# Patient Record
Sex: Female | Born: 1940 | Race: White | Hispanic: No | Marital: Single | Smoking: Never smoker
Health system: Southern US, Community
[De-identification: ages and names within clinical notes are randomized; demographics above are authoritative.]

## PROBLEM LIST (undated history)

## (undated) DIAGNOSIS — Z89611 Acquired absence of right leg above knee: Secondary | ICD-10-CM

## (undated) DIAGNOSIS — M069 Rheumatoid arthritis, unspecified: Secondary | ICD-10-CM

## (undated) DIAGNOSIS — M0510 Rheumatoid lung disease with rheumatoid arthritis of unspecified site: Secondary | ICD-10-CM

## (undated) DIAGNOSIS — I10 Essential (primary) hypertension: Secondary | ICD-10-CM

## (undated) DIAGNOSIS — I739 Peripheral vascular disease, unspecified: Secondary | ICD-10-CM

## (undated) HISTORY — PX: APPENDECTOMY: SHX54

---

## 2016-09-17 ENCOUNTER — Encounter (HOSPITAL_BASED_OUTPATIENT_CLINIC_OR_DEPARTMENT_OTHER): Payer: Self-pay | Admitting: *Deleted

## 2016-09-17 ENCOUNTER — Emergency Department (HOSPITAL_BASED_OUTPATIENT_CLINIC_OR_DEPARTMENT_OTHER): Payer: Medicare (Managed Care)

## 2016-09-17 ENCOUNTER — Inpatient Hospital Stay (HOSPITAL_BASED_OUTPATIENT_CLINIC_OR_DEPARTMENT_OTHER)
Admission: EM | Admit: 2016-09-17 | Discharge: 2016-09-21 | DRG: 871 | Disposition: A | Discharge status: Home / Self Care | Payer: Medicare (Managed Care) | Attending: Internal Medicine | Admitting: Internal Medicine

## 2016-09-17 DIAGNOSIS — E785 Hyperlipidemia, unspecified: Secondary | ICD-10-CM | POA: Diagnosis present

## 2016-09-17 DIAGNOSIS — A4151 Sepsis due to Escherichia coli [E. coli]: Principal | ICD-10-CM | POA: Diagnosis present

## 2016-09-17 DIAGNOSIS — Z89611 Acquired absence of right leg above knee: Secondary | ICD-10-CM

## 2016-09-17 DIAGNOSIS — L8990 Pressure ulcer of unspecified site, unspecified stage: Secondary | ICD-10-CM | POA: Diagnosis present

## 2016-09-17 DIAGNOSIS — M0510 Rheumatoid lung disease with rheumatoid arthritis of unspecified site: Secondary | ICD-10-CM | POA: Diagnosis present

## 2016-09-17 DIAGNOSIS — N289 Disorder of kidney and ureter, unspecified: Secondary | ICD-10-CM

## 2016-09-17 DIAGNOSIS — I10 Essential (primary) hypertension: Secondary | ICD-10-CM | POA: Diagnosis present

## 2016-09-17 DIAGNOSIS — I110 Hypertensive heart disease with heart failure: Secondary | ICD-10-CM | POA: Diagnosis present

## 2016-09-17 DIAGNOSIS — R7989 Other specified abnormal findings of blood chemistry: Secondary | ICD-10-CM

## 2016-09-17 DIAGNOSIS — J189 Pneumonia, unspecified organism: Secondary | ICD-10-CM | POA: Diagnosis present

## 2016-09-17 DIAGNOSIS — I248 Other forms of acute ischemic heart disease: Secondary | ICD-10-CM | POA: Diagnosis present

## 2016-09-17 DIAGNOSIS — R6510 Systemic inflammatory response syndrome (SIRS) of non-infectious origin without acute organ dysfunction: Secondary | ICD-10-CM | POA: Diagnosis present

## 2016-09-17 DIAGNOSIS — M25552 Pain in left hip: Secondary | ICD-10-CM

## 2016-09-17 DIAGNOSIS — R4182 Altered mental status, unspecified: Secondary | ICD-10-CM | POA: Diagnosis not present

## 2016-09-17 DIAGNOSIS — K7200 Acute and subacute hepatic failure without coma: Secondary | ICD-10-CM | POA: Diagnosis present

## 2016-09-17 DIAGNOSIS — R945 Abnormal results of liver function studies: Secondary | ICD-10-CM

## 2016-09-17 DIAGNOSIS — G8929 Other chronic pain: Secondary | ICD-10-CM | POA: Diagnosis present

## 2016-09-17 DIAGNOSIS — R778 Other specified abnormalities of plasma proteins: Secondary | ICD-10-CM | POA: Diagnosis present

## 2016-09-17 DIAGNOSIS — I739 Peripheral vascular disease, unspecified: Secondary | ICD-10-CM | POA: Diagnosis present

## 2016-09-17 DIAGNOSIS — E860 Dehydration: Secondary | ICD-10-CM | POA: Diagnosis present

## 2016-09-17 DIAGNOSIS — N390 Urinary tract infection, site not specified: Secondary | ICD-10-CM | POA: Diagnosis present

## 2016-09-17 DIAGNOSIS — H409 Unspecified glaucoma: Secondary | ICD-10-CM | POA: Diagnosis present

## 2016-09-17 DIAGNOSIS — L89151 Pressure ulcer of sacral region, stage 1: Secondary | ICD-10-CM | POA: Diagnosis present

## 2016-09-17 DIAGNOSIS — A419 Sepsis, unspecified organism: Secondary | ICD-10-CM

## 2016-09-17 DIAGNOSIS — I5032 Chronic diastolic (congestive) heart failure: Secondary | ICD-10-CM | POA: Diagnosis present

## 2016-09-17 DIAGNOSIS — W19XXXA Unspecified fall, initial encounter: Secondary | ICD-10-CM

## 2016-09-17 DIAGNOSIS — F419 Anxiety disorder, unspecified: Secondary | ICD-10-CM | POA: Diagnosis present

## 2016-09-17 DIAGNOSIS — N179 Acute kidney failure, unspecified: Secondary | ICD-10-CM | POA: Diagnosis present

## 2016-09-17 DIAGNOSIS — R000 Tachycardia, unspecified: Secondary | ICD-10-CM

## 2016-09-17 HISTORY — DX: Rheumatoid arthritis, unspecified: M06.9

## 2016-09-17 HISTORY — DX: Peripheral vascular disease, unspecified: I73.9

## 2016-09-17 HISTORY — DX: Acquired absence of right leg above knee: Z89.611

## 2016-09-17 HISTORY — DX: Essential (primary) hypertension: I10

## 2016-09-17 HISTORY — DX: Rheumatoid lung disease with rheumatoid arthritis of unspecified site: M05.10

## 2016-09-17 LAB — COMPREHENSIVE METABOLIC PANEL
ALK PHOS: 115 U/L (ref 38–126)
ALT: 837 U/L — AB (ref 14–54)
AST: 355 U/L — AB (ref 15–41)
Albumin: 2.7 g/dL — ABNORMAL LOW (ref 3.5–5.0)
Anion gap: 5 (ref 5–15)
BUN: 49 mg/dL — AB (ref 6–20)
CALCIUM: 7.6 mg/dL — AB (ref 8.9–10.3)
CHLORIDE: 105 mmol/L (ref 101–111)
CO2: 25 mmol/L (ref 22–32)
CREATININE: 2.29 mg/dL — AB (ref 0.44–1.00)
GFR calc Af Amer: 23 mL/min — ABNORMAL LOW (ref >60)
GFR calc non Af Amer: 20 mL/min — ABNORMAL LOW (ref >60)
Glucose, Bld: 91 mg/dL (ref 65–99)
Potassium: 3.4 mmol/L — ABNORMAL LOW (ref 3.5–5.1)
Sodium: 135 mmol/L (ref 135–145)
Total Bilirubin: 2.7 mg/dL — ABNORMAL HIGH (ref 0.3–1.2)
Total Protein: 5.4 g/dL — ABNORMAL LOW (ref 6.5–8.1)

## 2016-09-17 LAB — CBC WITH DIFFERENTIAL/PLATELET
Basophils Absolute: 0 10*3/uL (ref 0.0–0.1)
Basophils Relative: 0 %
Eosinophils Absolute: 0.1 10*3/uL (ref 0.0–0.7)
Eosinophils Relative: 1 %
HCT: 39.7 % (ref 36.0–46.0)
Hemoglobin: 13.1 g/dL (ref 12.0–15.0)
LYMPHS PCT: 5 %
Lymphs Abs: 0.5 10*3/uL — ABNORMAL LOW (ref 0.7–4.0)
MCH: 32.1 pg (ref 26.0–34.0)
MCHC: 33 g/dL (ref 30.0–36.0)
MCV: 97.3 fL (ref 78.0–100.0)
MONO ABS: 0.7 10*3/uL (ref 0.1–1.0)
MONOS PCT: 6 %
Neutro Abs: 9.6 10*3/uL — ABNORMAL HIGH (ref 1.7–7.7)
Neutrophils Relative %: 88 %
Platelets: 134 10*3/uL — ABNORMAL LOW (ref 150–400)
RBC: 4.08 MIL/uL (ref 3.87–5.11)
RDW: 12.8 % (ref 11.5–15.5)
WBC: 11 10*3/uL — ABNORMAL HIGH (ref 4.0–10.5)

## 2016-09-17 LAB — URINALYSIS, MICROSCOPIC (REFLEX)

## 2016-09-17 LAB — URINALYSIS, ROUTINE W REFLEX MICROSCOPIC
GLUCOSE, UA: NEGATIVE mg/dL
Ketones, ur: 15 mg/dL — AB
Nitrite: POSITIVE — AB
PROTEIN: 100 mg/dL — AB
SPECIFIC GRAVITY, URINE: 1.02 (ref 1.005–1.030)
pH: 5 (ref 5.0–8.0)

## 2016-09-17 LAB — I-STAT CG4 LACTIC ACID, ED: Lactic Acid, Venous: 0.93 mmol/L (ref 0.5–1.9)

## 2016-09-17 LAB — TROPONIN I: TROPONIN I: 0.39 ng/mL — AB (ref <0.03)

## 2016-09-17 MED ORDER — SODIUM CHLORIDE 0.9 % IV BOLUS (SEPSIS)
1000.0000 mL | Freq: Once | INTRAVENOUS | Status: AC
  Administered 2016-09-17: 1000 mL via INTRAVENOUS

## 2016-09-17 MED ORDER — PIPERACILLIN-TAZOBACTAM 3.375 G IVPB
INTRAVENOUS | Status: AC
  Administered 2016-09-18: 3.375 g via INTRAVENOUS
  Filled 2016-09-17: qty 50

## 2016-09-17 MED ORDER — VANCOMYCIN HCL IN DEXTROSE 1-5 GM/200ML-% IV SOLN
1000.0000 mg | Freq: Once | INTRAVENOUS | Status: AC
  Administered 2016-09-17: 1000 mg via INTRAVENOUS
  Filled 2016-09-17: qty 200

## 2016-09-17 MED ORDER — PIPERACILLIN-TAZOBACTAM 4.5 G IVPB
4.5000 g | Freq: Once | INTRAVENOUS | Status: DC
  Filled 2016-09-17: qty 100

## 2016-09-17 MED ORDER — SODIUM CHLORIDE 0.9 % IV BOLUS (SEPSIS)
1000.0000 mL | Freq: Once | INTRAVENOUS | Status: AC
Start: 2016-09-17 — End: 2016-09-18
  Administered 2016-09-17: 1000 mL via INTRAVENOUS

## 2016-09-17 NOTE — ED Provider Notes (Signed)
[facility] Provider Note   CSN: [id number] Arrival date & time: 09/17/16  2043  By signing my name below, I, Doreatha Martin, attest that this documentation has been prepared under the direction and in the presence of Jerelyn Scott, MD. Electronically Signed: Doreatha Martin, ED Scribe. 09/17/16. 10:11 PM.     History   Chief Complaint Chief Complaint  Patient presents with  . Altered Mental Status    HPI Allison Mendez is a 76 y.o. female who presents to the Emergency Department complaining of gradually worsening AMS s/p fall that occurred 2 days ago. Per sister, the pt vomited 2 days ago (which has since resolved), fell that night out of her bed and was groggy yesterday. Sister states the pt was falling asleep all day and seemed a bit confused during conversations as she jumped from topic to topic. Sister states the pt is drinking without difficulty, but eating less. Pt states she feels generally fatigued. Sister denies fever, current vomiting.   The history is provided by the patient and a relative. No language interpreter was used.  Altered Mental Status   This is a new problem. The current episode started 2 days ago. The problem has been gradually worsening. Associated symptoms include confusion.    Past Medical History:  Diagnosis Date  . Hypertension   . PVD (peripheral vascular disease) (HCC)   . RA (rheumatoid arthritis) (HCC)   . 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 Acute kidney injury (HCC) 09/18/2016    No past surgical history on file.  OB History    No data available       Home Medications    Prior to Admission medications   Not on File    Family History No family history on file.  Social History Social History  Substance Use Topics  . Smoking status: Never Smoker  . Smokeless tobacco: Not on file  . Alcohol use No     Allergies   Patient has no known allergies.   Review of Systems Review  of Systems  Constitutional: Positive for appetite change and fatigue.  Gastrointestinal: Negative for vomiting.  Psychiatric/Behavioral: Positive for confusion.  All other systems reviewed and are negative.   Physical Exam Updated Vital Signs BP 107/70   Pulse (!) 106   Temp 100.1 F (37.8 C) (Rectal)   Resp 20   Ht 5\' 7"  (1.702 m)   SpO2 98%  Vitals reviewed Physical Exam Physical Examination: General appearance - alert, chronically ill appearing, tired appearing, answering questions- but slowly Mental status - alert, oriented to person, place, and time Eyes - pupils equal and reactive, extraocular eye movements intact, no scleral icterus, no conjunctival injection Mouth - mucous membranes dry, OP clear Chest - clear to auscultation, no wheezes, rales or rhonchi, symmetric air entry Heart - rapid rate, regular rhythm, normal S1, S2, no murmurs, rubs, clicks or gallops Abdomen - soft, nontender, nondistended, no masses or organomegaly, nabs Neurological - alert, oriented x 3, normal speech, strength intact, pt answering questions but slowly/tired appearing, sensation intact Extremities - peripheral pulses normal, no pedal edema, no clubbing or cyanosis Skin - normal coloration, poor skin turgor, no rashes  ED Treatments / Results   DIAGNOSTIC STUDIES: Oxygen Saturation is 98% on [place] 2L, normal by my interpretation.    COORDINATION OF CARE: 10:11 PM Discussed treatment plan with pt at bedside which includes labs and pt agreed to plan.  Labs (all labs ordered are listed, but only abnormal results are displayed) Labs Reviewed  COMPREHENSIVE METABOLIC PANEL - Abnormal; Notable for the following:       Result Value   Potassium 3.4 (*)    BUN 49 (*)    Creatinine, Ser 2.29 (*)    Calcium 7.6 (*)    Total Protein 5.4 (*)    Albumin 2.7 (*)    AST 355 (*)    ALT 837 (*)    Total Bilirubin 2.7 (*)    GFR calc non Af Amer 20 (*)    GFR calc Af Amer 23 (*)    All other  components within normal limits  TROPONIN I - Abnormal; Notable for the following:    Troponin I 0.39 (*)    All other components within normal limits  URINALYSIS, ROUTINE W REFLEX MICROSCOPIC - Abnormal; Notable for the following:    Color, Urine ORANGE (*)    APPearance CLOUDY (*)    Hgb urine dipstick MODERATE (*)    Bilirubin Urine LARGE (*)    Ketones, ur 15 (*)    Protein, ur 100 (*)    Nitrite POSITIVE (*)    Leukocytes, UA TRACE (*)    All other components within normal limits  CBC WITH DIFFERENTIAL/PLATELET - Abnormal; Notable for the following:    WBC 11.0 (*)    Platelets 134 (*)    Neutro Abs 9.6 (*)    Lymphs Abs 0.5 (*)    All other components within normal limits  URINALYSIS, MICROSCOPIC (REFLEX) - Abnormal; Notable for the following:    Bacteria, UA FEW (*)    Squamous Epithelial / LPF 0-5 (*)    All other components within normal limits  ACETAMINOPHEN LEVEL - Abnormal; Notable for the following:    Acetaminophen (Tylenol), Serum <10 (*)    All other components within normal limits  CULTURE, BLOOD (ROUTINE X 2)  CULTURE, BLOOD (ROUTINE X 2)  URINE CULTURE  AMMONIA  CBC WITH DIFFERENTIAL/PLATELET  I-STAT CG4 LACTIC ACID, ED  I-STAT CG4 LACTIC ACID, ED    EKG  EKG Interpretation  Date/Time:  Wednesday September 17 2016 21:05:59 EDT Ventricular Rate:  131 PR Interval:    QRS Duration: 82 QT Interval:  296 QTC Calculation: 437 R Axis:   81 Text Interpretation:  Sinus tachycardia Borderline right axis deviation No old tracing to compare Confirmed by [facility]  MD, MARTHA [phone]) on 09/17/2016 11:10:40 PM       Radiology Dg Chest 2 View  Result Date: 09/17/2016 CLINICAL DATA:  Acute onset of confusion and lethargy. Status post fall out of bed 2 days ago. Initial encounter. EXAM: CHEST  2 VIEW COMPARISON:  None. FINDINGS: The lungs are well-aerated. Mild bibasilar opacities raise question for mild pneumonia. There is no evidence of pleural effusion or  pneumothorax. The heart is borderline normal in size. No acute osseous abnormalities are seen. IMPRESSION: Mild bibasilar opacities raise question for mild pneumonia. Electronically Signed   By: Roanna Raider M.D.   On: 09/17/2016 21:48   Ct Head Wo Contrast  Result Date: 09/17/2016 CLINICAL DATA:  Status post fall 2 days ago, with injury to the head. Confusion. Initial encounter. EXAM: CT HEAD WITHOUT CONTRAST TECHNIQUE: Contiguous axial images were obtained from the base of the skull through the vertex without intravenous contrast. COMPARISON:  None. FINDINGS: Brain: No evidence of acute infarction, hemorrhage, hydrocephalus, extra-axial collection or mass lesion/mass effect. Prominence of the ventricles and sulci reflects mild  to moderate cortical volume loss. Mild cerebellar atrophy is noted. Scattered periventricular and subcortical white matter change likely reflects small vessel ischemic microangiopathy. Chronic ischemic change is noted at the external capsule bilaterally. The brainstem and fourth ventricle are within normal limits. The cerebral hemispheres demonstrate grossly normal gray-white differentiation. No mass effect or midline shift is seen. Vascular: No hyperdense vessel or unexpected calcification. Skull: There is no evidence of fracture; visualized osseous structures are unremarkable in appearance. Sinuses/Orbits: The visualized portions of the orbits are within normal limits. The paranasal sinuses and mastoid air cells are well-aerated. Other: No significant soft tissue abnormalities are seen. IMPRESSION: 1. No evidence of traumatic intracranial injury or fracture. 2. Mild to moderate cortical volume loss and scattered small vessel ischemic microangiopathy. 3. Chronic ischemic change at the external capsule bilaterally. Electronically Signed   By: Roanna Raider M.D.   On: 09/17/2016 21:49    Procedures Procedures (including critical care time)  Medications Ordered in ED Medications    0.9 %  sodium chloride infusion ( Intravenous New Bag/Given 09/18/16 0042)  sodium chloride 0.9 % bolus 1,000 mL (0 mLs Intravenous Stopped 09/17/16 2224)  vancomycin (VANCOCIN) IVPB 1000 mg/200 mL premix (0 mg Intravenous Stopped 09/18/16 0013)  sodium chloride 0.9 % bolus 1,000 mL (0 mLs Intravenous Stopped 09/18/16 0013)  piperacillin-tazobactam (ZOSYN) IVPB 3.375 g (0 g Intravenous Stopped 09/18/16 0042)     Initial Impression / Assessment and Plan / ED Course  I have reviewed the triage vital signs and the nursing notes.  Pertinent labs & imaging results that were available during my care of the patient were reviewed by me and considered in my medical decision making (see chart for details).    12:29 AM d/w Dr. Maryfrances Bunnell, [facility] hospitalist, who accepts patient for admission.  He requests adding on tylenol level which has been ordered. Pt appears improved after IV fluids and is more alert and responding more quickly to questions, famiily agrees that she is more close to her baseline.  She continues to have no vomiting and no abdominal pain.    Pt presenting with c/o decreased mental status, dehydration, decreased po intake.  Pt with findings of UTI, urine and blood culture sent.  She has some renal insufficiency, lft elevation- d/w patient and family and they are not aware of any history of this in the past.  Pt started on broad spectrum abx.  Ammonia and tylenol levels are reassuring.  Pt admitted to [facility] for further workup and management. Her vitals continue to improve.    Final Clinical Impressions(s) / ED Diagnoses   Final diagnoses:  Sepsis, due to unspecified organism [facility])  Urinary tract infection without hematuria, site unspecified  Elevated LFTs  Renal insufficiency  Tachycardia    New Prescriptions New Prescriptions   No medications on file   I personally performed the services described in this documentation, which was scribed in my presence. The recorded information has  been reviewed and is accurate.     Jerelyn Scott, MD 09/18/16 [phone]

## 2016-09-17 NOTE — ED Triage Notes (Addendum)
Sister states confusion and unable to stay awake after head injury x 2 days ago , fall from bed . Pt unable to answer questions in triage

## 2016-09-17 NOTE — ED Notes (Signed)
Patient transported to CT 

## 2016-09-17 NOTE — ED Notes (Signed)
ED Provider at bedside. 

## 2016-09-17 NOTE — ED Notes (Signed)
Pt placed on oxygen 2L via [place]. RT at bedside for evaluation.

## 2016-09-17 NOTE — ED Notes (Signed)
Attempted blood draw unsuccessful x 2 attempts with 22g butterfly.

## 2016-09-18 ENCOUNTER — Inpatient Hospital Stay (HOSPITAL_COMMUNITY): Payer: Medicare (Managed Care)

## 2016-09-18 ENCOUNTER — Other Ambulatory Visit (HOSPITAL_COMMUNITY): Payer: Medicare (Managed Care)

## 2016-09-18 ENCOUNTER — Encounter (HOSPITAL_BASED_OUTPATIENT_CLINIC_OR_DEPARTMENT_OTHER): Payer: Self-pay | Admitting: Emergency Medicine

## 2016-09-18 DIAGNOSIS — J189 Pneumonia, unspecified organism: Secondary | ICD-10-CM | POA: Diagnosis present

## 2016-09-18 DIAGNOSIS — I10 Essential (primary) hypertension: Secondary | ICD-10-CM | POA: Diagnosis present

## 2016-09-18 DIAGNOSIS — R6510 Systemic inflammatory response syndrome (SIRS) of non-infectious origin without acute organ dysfunction: Secondary | ICD-10-CM | POA: Diagnosis present

## 2016-09-18 DIAGNOSIS — L89151 Pressure ulcer of sacral region, stage 1: Secondary | ICD-10-CM | POA: Diagnosis present

## 2016-09-18 DIAGNOSIS — A419 Sepsis, unspecified organism: Secondary | ICD-10-CM

## 2016-09-18 DIAGNOSIS — R748 Abnormal levels of other serum enzymes: Secondary | ICD-10-CM | POA: Diagnosis not present

## 2016-09-18 DIAGNOSIS — I5032 Chronic diastolic (congestive) heart failure: Secondary | ICD-10-CM | POA: Diagnosis present

## 2016-09-18 DIAGNOSIS — N179 Acute kidney failure, unspecified: Secondary | ICD-10-CM | POA: Diagnosis present

## 2016-09-18 DIAGNOSIS — E785 Hyperlipidemia, unspecified: Secondary | ICD-10-CM | POA: Diagnosis present

## 2016-09-18 DIAGNOSIS — I739 Peripheral vascular disease, unspecified: Secondary | ICD-10-CM | POA: Diagnosis present

## 2016-09-18 DIAGNOSIS — I159 Secondary hypertension, unspecified: Secondary | ICD-10-CM | POA: Diagnosis not present

## 2016-09-18 DIAGNOSIS — N390 Urinary tract infection, site not specified: Secondary | ICD-10-CM | POA: Diagnosis present

## 2016-09-18 DIAGNOSIS — Z89611 Acquired absence of right leg above knee: Secondary | ICD-10-CM | POA: Diagnosis not present

## 2016-09-18 DIAGNOSIS — R945 Abnormal results of liver function studies: Secondary | ICD-10-CM

## 2016-09-18 DIAGNOSIS — I110 Hypertensive heart disease with heart failure: Secondary | ICD-10-CM | POA: Diagnosis present

## 2016-09-18 DIAGNOSIS — I248 Other forms of acute ischemic heart disease: Secondary | ICD-10-CM | POA: Diagnosis present

## 2016-09-18 DIAGNOSIS — M0510 Rheumatoid lung disease with rheumatoid arthritis of unspecified site: Secondary | ICD-10-CM | POA: Diagnosis present

## 2016-09-18 DIAGNOSIS — R4182 Altered mental status, unspecified: Secondary | ICD-10-CM | POA: Diagnosis present

## 2016-09-18 DIAGNOSIS — G8929 Other chronic pain: Secondary | ICD-10-CM | POA: Diagnosis present

## 2016-09-18 DIAGNOSIS — A4151 Sepsis due to Escherichia coli [E. coli]: Secondary | ICD-10-CM | POA: Diagnosis present

## 2016-09-18 DIAGNOSIS — F419 Anxiety disorder, unspecified: Secondary | ICD-10-CM | POA: Diagnosis present

## 2016-09-18 DIAGNOSIS — E860 Dehydration: Secondary | ICD-10-CM | POA: Diagnosis present

## 2016-09-18 DIAGNOSIS — R7989 Other specified abnormal findings of blood chemistry: Secondary | ICD-10-CM | POA: Diagnosis not present

## 2016-09-18 DIAGNOSIS — R778 Other specified abnormalities of plasma proteins: Secondary | ICD-10-CM | POA: Diagnosis present

## 2016-09-18 DIAGNOSIS — R000 Tachycardia, unspecified: Secondary | ICD-10-CM | POA: Diagnosis not present

## 2016-09-18 DIAGNOSIS — H409 Unspecified glaucoma: Secondary | ICD-10-CM | POA: Diagnosis present

## 2016-09-18 DIAGNOSIS — L8990 Pressure ulcer of unspecified site, unspecified stage: Secondary | ICD-10-CM | POA: Diagnosis present

## 2016-09-18 DIAGNOSIS — K7200 Acute and subacute hepatic failure without coma: Secondary | ICD-10-CM | POA: Diagnosis present

## 2016-09-18 LAB — CBC
HEMATOCRIT: 39.1 % (ref 36.0–46.0)
Hemoglobin: 13 g/dL (ref 12.0–15.0)
MCH: 32.2 pg (ref 26.0–34.0)
MCHC: 33.2 g/dL (ref 30.0–36.0)
MCV: 96.8 fL (ref 78.0–100.0)
PLATELETS: 131 10*3/uL — AB (ref 150–400)
RBC: 4.04 MIL/uL (ref 3.87–5.11)
RDW: 13.4 % (ref 11.5–15.5)
WBC: 10.6 10*3/uL — ABNORMAL HIGH (ref 4.0–10.5)

## 2016-09-18 LAB — BLOOD CULTURE ID PANEL (REFLEXED)
ACINETOBACTER BAUMANNII: NOT DETECTED
CANDIDA ALBICANS: NOT DETECTED
CANDIDA KRUSEI: NOT DETECTED
CARBAPENEM RESISTANCE: NOT DETECTED
Candida glabrata: NOT DETECTED
Candida parapsilosis: NOT DETECTED
Candida tropicalis: NOT DETECTED
ENTEROBACTER CLOACAE COMPLEX: NOT DETECTED
ENTEROBACTERIACEAE SPECIES: DETECTED — AB
ENTEROCOCCUS SPECIES: NOT DETECTED
Escherichia coli: DETECTED — AB
HAEMOPHILUS INFLUENZAE: NOT DETECTED
KLEBSIELLA OXYTOCA: NOT DETECTED
Klebsiella pneumoniae: NOT DETECTED
LISTERIA MONOCYTOGENES: NOT DETECTED
Neisseria meningitidis: NOT DETECTED
PSEUDOMONAS AERUGINOSA: NOT DETECTED
Proteus species: NOT DETECTED
STREPTOCOCCUS PNEUMONIAE: NOT DETECTED
STREPTOCOCCUS PYOGENES: NOT DETECTED
STREPTOCOCCUS SPECIES: NOT DETECTED
Serratia marcescens: NOT DETECTED
Staphylococcus aureus (BCID): NOT DETECTED
Staphylococcus species: NOT DETECTED
Streptococcus agalactiae: NOT DETECTED

## 2016-09-18 LAB — PROTIME-INR
INR: 1.05
Prothrombin Time: 13.7 seconds (ref 11.4–15.2)

## 2016-09-18 LAB — LIPASE, BLOOD: LIPASE: 22 U/L (ref 11–51)

## 2016-09-18 LAB — COMPREHENSIVE METABOLIC PANEL
ALT: 707 U/L — ABNORMAL HIGH (ref 14–54)
ANION GAP: 13 (ref 5–15)
AST: 250 U/L — ABNORMAL HIGH (ref 15–41)
Albumin: 2.7 g/dL — ABNORMAL LOW (ref 3.5–5.0)
Alkaline Phosphatase: 109 U/L (ref 38–126)
BUN: 35 mg/dL — ABNORMAL HIGH (ref 6–20)
CHLORIDE: 110 mmol/L (ref 101–111)
CO2: 18 mmol/L — AB (ref 22–32)
Calcium: 7.8 mg/dL — ABNORMAL LOW (ref 8.9–10.3)
Creatinine, Ser: 1.52 mg/dL — ABNORMAL HIGH (ref 0.44–1.00)
GFR calc non Af Amer: 33 mL/min — ABNORMAL LOW (ref >60)
GFR, EST AFRICAN AMERICAN: 38 mL/min — AB (ref >60)
Glucose, Bld: 85 mg/dL (ref 65–99)
Potassium: 3.7 mmol/L (ref 3.5–5.1)
SODIUM: 141 mmol/L (ref 135–145)
Total Bilirubin: 2.3 mg/dL — ABNORMAL HIGH (ref 0.3–1.2)
Total Protein: 5.3 g/dL — ABNORMAL LOW (ref 6.5–8.1)

## 2016-09-18 LAB — AMMONIA: AMMONIA: 13 umol/L (ref 9–35)

## 2016-09-18 LAB — MRSA PCR SCREENING: MRSA BY PCR: NEGATIVE

## 2016-09-18 LAB — PHOSPHORUS: PHOSPHORUS: 3.6 mg/dL (ref 2.5–4.6)

## 2016-09-18 LAB — ACETAMINOPHEN LEVEL

## 2016-09-18 LAB — MAGNESIUM: Magnesium: 1.8 mg/dL (ref 1.7–2.4)

## 2016-09-18 LAB — TROPONIN I
TROPONIN I: 0.03 ng/mL — AB (ref <0.03)
TROPONIN I: 0.16 ng/mL — AB (ref <0.03)

## 2016-09-18 LAB — BILIRUBIN, DIRECT: Bilirubin, Direct: 1.4 mg/dL — ABNORMAL HIGH (ref 0.1–0.5)

## 2016-09-18 MED ORDER — PIPERACILLIN-TAZOBACTAM 3.375 G IVPB
3.3750 g | Freq: Three times a day (TID) | INTRAVENOUS | Status: DC
  Administered 2016-09-18 – 2016-09-19 (×2): 3.375 g via INTRAVENOUS
  Filled 2016-09-18 (×3): qty 50

## 2016-09-18 MED ORDER — DEXTROSE 5 % IV SOLN
250.0000 mg | INTRAVENOUS | Status: DC
  Administered 2016-09-19: 250 mg via INTRAVENOUS
  Filled 2016-09-18: qty 250

## 2016-09-18 MED ORDER — OXYCODONE HCL 5 MG PO TABS: 5.0000 mg | ORAL_TABLET | Freq: Once | ORAL | Status: DC

## 2016-09-18 MED ORDER — SODIUM CHLORIDE 0.9 % IV SOLN
INTRAVENOUS | Status: DC
  Administered 2016-09-18 – 2016-09-20 (×4): via INTRAVENOUS

## 2016-09-18 MED ORDER — TEMAZEPAM 15 MG PO CAPS
15.0000 mg | ORAL_CAPSULE | Freq: Every evening | ORAL | Status: DC | PRN
  Administered 2016-09-18 – 2016-09-20 (×2): 15 mg via ORAL
  Filled 2016-09-18: qty 1
  Filled 2016-09-18: qty 2

## 2016-09-18 MED ORDER — PIPERACILLIN-TAZOBACTAM IN DEX 2-0.25 GM/50ML IV SOLN
2.2500 g | Freq: Three times a day (TID) | INTRAVENOUS | Status: DC
  Administered 2016-09-18: 2.25 g via INTRAVENOUS
  Filled 2016-09-18 (×4): qty 50

## 2016-09-18 MED ORDER — SODIUM CHLORIDE 0.9% FLUSH
3.0000 mL | Freq: Two times a day (BID) | INTRAVENOUS | Status: DC
  Administered 2016-09-18 – 2016-09-21 (×7): 3 mL via INTRAVENOUS

## 2016-09-18 MED ORDER — IBUPROFEN 200 MG PO TABS: 600.0000 mg | ORAL_TABLET | Freq: Three times a day (TID) | ORAL | Status: DC

## 2016-09-18 MED ORDER — PREGABALIN 50 MG PO CAPS
75.0000 mg | ORAL_CAPSULE | Freq: Two times a day (BID) | ORAL | Status: DC
  Administered 2016-09-18 – 2016-09-20 (×4): 75 mg via ORAL
  Filled 2016-09-18 (×6): qty 1

## 2016-09-18 MED ORDER — NITROGLYCERIN 0.4 MG SL SUBL: 0.4000 mg | SUBLINGUAL_TABLET | SUBLINGUAL | Status: DC | PRN

## 2016-09-18 MED ORDER — ALBUTEROL SULFATE (2.5 MG/3ML) 0.083% IN NEBU: 2.5000 mg | INHALATION_SOLUTION | RESPIRATORY_TRACT | Status: DC | PRN

## 2016-09-18 MED ORDER — GUAIFENESIN ER 600 MG PO TB12
600.0000 mg | ORAL_TABLET | Freq: Two times a day (BID) | ORAL | Status: DC
  Administered 2016-09-18 – 2016-09-20 (×4): 600 mg via ORAL
  Filled 2016-09-18 (×5): qty 1

## 2016-09-18 MED ORDER — IPRATROPIUM-ALBUTEROL 0.5-2.5 (3) MG/3ML IN SOLN
3.0000 mL | Freq: Four times a day (QID) | RESPIRATORY_TRACT | Status: DC
  Filled 2016-09-18: qty 3

## 2016-09-18 MED ORDER — HEPARIN SODIUM (PORCINE) 5000 UNIT/ML IJ SOLN
5000.0000 [IU] | Freq: Three times a day (TID) | INTRAMUSCULAR | Status: DC
  Administered 2016-09-18 – 2016-09-21 (×9): 5000 [IU] via SUBCUTANEOUS
  Filled 2016-09-18 (×9): qty 1

## 2016-09-18 MED ORDER — CALCIUM GLUCONATE 10 % IV SOLN
1.0000 g | Freq: Once | INTRAVENOUS | Status: AC
  Administered 2016-09-18: 1 g via INTRAVENOUS
  Filled 2016-09-18: qty 10

## 2016-09-18 MED ORDER — PREDNISONE 5 MG PO TABS
5.0000 mg | ORAL_TABLET | Freq: Every day | ORAL | Status: DC
  Administered 2016-09-18 – 2016-09-19 (×2): 5 mg via ORAL
  Filled 2016-09-18 (×2): qty 1

## 2016-09-18 MED ORDER — AMITRIPTYLINE HCL 10 MG PO TABS
20.0000 mg | ORAL_TABLET | Freq: Every day | ORAL | Status: DC
  Administered 2016-09-18 – 2016-09-20 (×3): 20 mg via ORAL
  Filled 2016-09-18 (×4): qty 2

## 2016-09-18 MED ORDER — ONDANSETRON HCL 4 MG PO TABS: 4.0000 mg | ORAL_TABLET | Freq: Four times a day (QID) | ORAL | Status: DC | PRN

## 2016-09-18 MED ORDER — TIMOLOL MALEATE 0.5 % OP SOLG
1.0000 [drp] | OPHTHALMIC | Status: DC
  Administered 2016-09-19 – 2016-09-21 (×3): 1 [drp] via OPHTHALMIC
  Filled 2016-09-18: qty 5

## 2016-09-18 MED ORDER — VANCOMYCIN HCL 500 MG IV SOLR: 500.0000 mg | INTRAVENOUS | Status: DC

## 2016-09-18 MED ORDER — HYDRALAZINE HCL 20 MG/ML IJ SOLN
10.0000 mg | Freq: Three times a day (TID) | INTRAMUSCULAR | Status: DC | PRN
Start: 2016-09-18 — End: 2016-09-19

## 2016-09-18 MED ORDER — CYCLOBENZAPRINE HCL 10 MG PO TABS
10.0000 mg | ORAL_TABLET | Freq: Every day | ORAL | Status: DC
  Administered 2016-09-18 – 2016-09-21 (×4): 10 mg via ORAL
  Filled 2016-09-18 (×4): qty 1

## 2016-09-18 MED ORDER — SODIUM CHLORIDE 0.9 % IV SOLN
INTRAVENOUS | Status: AC
  Administered 2016-09-18: 01:00:00 via INTRAVENOUS

## 2016-09-18 MED ORDER — ONDANSETRON HCL 4 MG/2ML IJ SOLN: 4.0000 mg | Freq: Four times a day (QID) | INTRAMUSCULAR | Status: DC | PRN

## 2016-09-18 MED ORDER — ASPIRIN 325 MG PO TABS
325.0000 mg | ORAL_TABLET | Freq: Every day | ORAL | Status: DC
  Administered 2016-09-18 – 2016-09-20 (×3): 325 mg via ORAL
  Filled 2016-09-18 (×3): qty 1

## 2016-09-18 MED ORDER — ACETAMINOPHEN 650 MG RE SUPP: 650.0000 mg | Freq: Four times a day (QID) | RECTAL | Status: DC | PRN

## 2016-09-18 MED ORDER — HYDROCODONE-ACETAMINOPHEN 10-325 MG PO TABS
1.0000 | ORAL_TABLET | ORAL | Status: DC | PRN
  Administered 2016-09-18 – 2016-09-21 (×3): 1 via ORAL
  Filled 2016-09-18 (×3): qty 1

## 2016-09-18 MED ORDER — ACETAMINOPHEN 325 MG PO TABS: 650.0000 mg | ORAL_TABLET | Freq: Four times a day (QID) | ORAL | Status: DC | PRN

## 2016-09-18 MED ORDER — DEXTROSE 5 % IV SOLN
500.0000 mg | Freq: Once | INTRAVENOUS | Status: AC
  Administered 2016-09-18: 500 mg via INTRAVENOUS
  Filled 2016-09-18: qty 500

## 2016-09-18 MED ORDER — PIPERACILLIN-TAZOBACTAM 3.375 G IVPB
3.3750 g | Freq: Once | INTRAVENOUS | Status: AC
  Administered 2016-09-18: 3.375 g via INTRAVENOUS

## 2016-09-18 NOTE — Consult Note (Signed)
WOC Nurse wound consult note Reason for Consult: multiple wounds  Wound type: Left lower extremity and R upper has multiple areas most likely from trauma, Sacrum stage 1- most likely a combination of MASD and pressure; two skin tears to R distal upper extremity; Left medial malleolus   Pressure Injury POA: Yes  Measurement: Sacrum 3cm x 4cm; skin tears to RLE both 0.2cm x 1cm x 0.1cm; L medial malleolus 1cm x 1cm  Wound bed: skin tears 100% pink; L medial malleolus scabbed area; Sacrum intact red skin that does not blanch   Drainage (amount, consistency, odor): none  Periwound: Intact  Dressing procedure/placement/frequency: Foam dressing to skin tear, L leg wound, and sacral pressure injury. Change foam dressing every 3 days.   Will not follow patient, Please re-consult if further WOC assistance needed.   Ferman Hamming, RN- Grad Student [employer] With Armen Pickup, RN, MSN, CWOCN, CNS

## 2016-09-18 NOTE — Progress Notes (Signed)
[facility] Transfer Accept Note  76 yo F with RA, HTN, and hx BrCa who presents with AKI, liver injury and elevated troponin.    Started to feel vague malaise 2 days ago, vomited a few times, then today fell OOB and appeared sluggish and weak and almost confused to her sister, who brought her in.  BP 118/76   Pulse (!) 111   Temp 100.1 F (37.8 C) (Rectal)   Resp (!) 21   Ht _0  (1.702 m)   SpO2 96%   Low grade temp, tachycardic to 130s on arrival.  BP stable. Na 135, K 3.4, Cr 2.29 (old [place] records show Cr ~0.7 in 2015, but also mention CKD III), WBC 11K, Hgb 13.1 Troponin 0.39 UA with nitrites, few RBCs CXR with ?bibasilar opacities vs atelectasis, no symptmos of pneumonia CT head unremarkable Lactate normal    EDP gave 2L NS and vanc/Zosyn, obtained cultures.  Suspects UTI, sepsis.  She appears better with fluids, HR improving. Will add acetaminophen level.  They have no [place] at night. To SDU, inpatient status.  Will need abdomen imaging here.

## 2016-09-18 NOTE — Progress Notes (Signed)
Pharmacy Antibiotic Note  Allison Mendez is a 76 y.o. female admitted on 09/17/2016 with sepsis.  Pharmacy has been consulted for Vancomycin dosing. WBC 11, noted renal dysfunction.   Plan: Vancomycin 500 mg IV q48h, adjust dose if renal function changes Zosyn per MD Trend WBC, temp, renal function  F/U infectious work-up Drug levels as indicated   Height: 5\' 7"  (170.2 cm) Weight: 91 lb 6.4 oz (41.5 kg) IBW/kg (Calculated) : 61.6  Temp (24hrs), Avg:99.2 F (37.3 C), Min:98.3 F (36.8 C), Max:100.1 F (37.8 C)   Recent Labs Lab 09/17/16 2200 09/17/16 2221 09/17/16 2235  WBC  --  11.0*  --   CREATININE 2.29*  --   --   LATICACIDVEN  --   --  0.93    Estimated Creatinine Clearance: 14.1 mL/min (A) (by C-G formula based on SCr of 2.29 mg/dL (H)).    No Known Allergies  11/17/16 09/18/2016 7:32 AM

## 2016-09-18 NOTE — Progress Notes (Signed)
PHARMACY - PHYSICIAN COMMUNICATION CRITICAL VALUE ALERT - BLOOD CULTURE IDENTIFICATION (BCID)  Results for orders placed or performed during the hospital encounter of 09/17/16  Blood Culture ID Panel (Reflexed) (Collected: 09/17/2016 10:55 PM)  Result Value Ref Range   Enterococcus species NOT DETECTED NOT DETECTED   Listeria monocytogenes NOT DETECTED NOT DETECTED   Staphylococcus species NOT DETECTED NOT DETECTED   Staphylococcus aureus NOT DETECTED NOT DETECTED   Streptococcus species NOT DETECTED NOT DETECTED   Streptococcus agalactiae NOT DETECTED NOT DETECTED   Streptococcus pneumoniae NOT DETECTED NOT DETECTED   Streptococcus pyogenes NOT DETECTED NOT DETECTED   Acinetobacter baumannii NOT DETECTED NOT DETECTED   Enterobacteriaceae species DETECTED (A) NOT DETECTED   Enterobacter cloacae complex NOT DETECTED NOT DETECTED   Escherichia coli DETECTED (A) NOT DETECTED   Klebsiella oxytoca NOT DETECTED NOT DETECTED   Klebsiella pneumoniae NOT DETECTED NOT DETECTED   Proteus species NOT DETECTED NOT DETECTED   Serratia marcescens NOT DETECTED NOT DETECTED   Carbapenem resistance NOT DETECTED NOT DETECTED   Haemophilus influenzae NOT DETECTED NOT DETECTED   Neisseria meningitidis NOT DETECTED NOT DETECTED   Pseudomonas aeruginosa NOT DETECTED NOT DETECTED   Candida albicans NOT DETECTED NOT DETECTED   Candida glabrata NOT DETECTED NOT DETECTED   Candida krusei NOT DETECTED NOT DETECTED   Candida parapsilosis NOT DETECTED NOT DETECTED   Candida tropicalis NOT DETECTED NOT DETECTED    Name of physician (or Provider) Contacted: [facility]  Changes to prescribed antibiotics required: Remains on Zosyn, vancomycin and azithromycin. Will adjust Zosyn dose with improvement in renal function but could likely get ready of Azithromycin and vancomycin.   Sheron Nightingale 09/18/2016  9:11 PM

## 2016-09-18 NOTE — Progress Notes (Addendum)
Pt admitted to unit at approx 0300 from [facility] in [place]. On admission pt noted with scabbed circular wound to left medial ankle, left heel reddedened, foam applied. Healing skin tear to right thigh. 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 Two skin tears also noted to right forearm, foam applied. Sacral redness noted with superficial broken skin, foam applied. Scattered bruising to bil arms, rt thigh, chest, large bruising to left lower extremity. Pt also complaining of pain to the left hip.

## 2016-09-18 NOTE — H&P (Addendum)
[facility] History and Physical  Stavroula Nickel TFT:[id number] DOB: 1941/01/28 DOA: 09/17/2016  Referring physician:  PCP: Pcp Not In System   Chief Complaint: "My sister felt I didn't look right."  HPI: Allison Mendez is a 76 y.o. female  with past medical history hypertension, peripheral vascular disease, rheumatoid arthritis, rheumatoid lung presents to the hospital with fever. Patient states she's been in her normal state of health mostly. Decreased by mouth intake of solid foods. Has been drinking well. Has been taking all medications as directed. Had a recent flu shot. Denies any sick contacts. Has had some cough. Cough has been nonproductive. No chills. No nausea vomiting.  ED course: Chest x-ray showed likely pneumonia. Patient started on antibiotics & hospitalist was consulted for admission.   Review of Systems:  As per HPI otherwise 10 point review of systems negative.    Past Medical History:  Diagnosis Date  . Hypertension   . PVD (peripheral vascular disease) (HCC)   . RA (rheumatoid arthritis) (HCC)   . Rheumatoid lung (HCC)   . 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 APPENDECTOMY     Social History:  reports that she has never smoked. She has never used smokeless tobacco. She reports that she does not drink alcohol. Her drug history is not on file.  No Known Allergies  Family History  Problem Relation Age of Onset  . Rheum arthritis Neg Hx      Prior to Admission medications   Not on File   Physical Exam: Vitals:   09/18/16 0330 09/18/16 0430 09/18/16 0500 09/18/16 0530  BP: (!) 143/89 107/84 139/82 111/87  Pulse: (!) 104     Resp: 17 (!) 23 (!) 23 (!) 22  Temp:      TempSrc:      SpO2: 96%     Weight:      Height:        Wt Readings from Last 3 Encounters:  09/18/16 41.5 kg (91 lb 6.4 oz)    General:  Appears calm and comfortable Eyes:  PERRL, EOMI, normal lids, iris ENT:  grossly  normal hearing, lips & tongue Neck:  no LAD, masses or thyromegaly Cardiovascular:  RRR, no m/r/g. No LE edema.  Respiratory:  CTA bilaterally, no w/r/r. Normal respiratory effort. Abdomen:  soft, ntnd Skin:  no rash or induration seen on limited exam Musculoskeletal:  grossly normal tone BUE/BLE; Rheumatoid changes in hands, right lower extremity AKA Psychiatric:  grossly normal mood and affect, speech fluent and appropriate Neurologic:  CN 2-12 grossly intact, moves all extremities in coordinated fashion.          Labs on Admission:  Basic Metabolic Panel:  Recent Labs Lab 09/17/16 2200  NA 135  K 3.4*  CL 105  CO2 25  GLUCOSE 91  BUN 49*  CREATININE 2.29*  CALCIUM 7.6*   Liver Function Tests:  Recent Labs Lab 09/17/16 2200  AST 355*  ALT 837*  ALKPHOS 115  BILITOT 2.7*  PROT 5.4*  ALBUMIN 2.7*   No results for input(s): LIPASE, AMYLASE in the last 168 hours.  Recent Labs Lab 09/18/16 0005  AMMONIA 13   CBC:  Recent Labs Lab 09/17/16 2221  WBC 11.0*  NEUTROABS 9.6*  HGB 13.1  HCT 39.7  MCV 97.3  PLT 134*   Cardiac Enzymes:  Recent Labs Lab 09/17/16 2200  TROPONINI 0.39*    BNP (last 3 results) No  results for input(s): BNP in the last 8760 hours.  ProBNP (last 3 results) No results for input(s): PROBNP in the last 8760 hours.   Serum creatinine: 2.29 mg/dL (H) 63/87/56 4332 Estimated creatinine clearance: 14.1 mL/min (A)  CBG: No results for input(s): GLUCAP in the last 168 hours.  Radiological Exams on Admission: Dg Chest 2 View  Result Date: 09/17/2016 CLINICAL DATA:  Acute onset of confusion and lethargy. Status post fall out of bed 2 days ago. Initial encounter. EXAM: CHEST  2 VIEW COMPARISON:  None. FINDINGS: The lungs are well-aerated. Mild bibasilar opacities raise question for mild pneumonia. There is no evidence of pleural effusion or pneumothorax. The heart is borderline normal in size. No acute osseous abnormalities are  seen. IMPRESSION: Mild bibasilar opacities raise question for mild pneumonia. Electronically Signed   By: Roanna Raider M.D.   On: 09/17/2016 21:48   Ct Head Wo Contrast  Result Date: 09/17/2016 CLINICAL DATA:  Status post fall 2 days ago, with injury to the head. Confusion. Initial encounter. EXAM: CT HEAD WITHOUT CONTRAST TECHNIQUE: Contiguous axial images were obtained from the base of the skull through the vertex without intravenous contrast. COMPARISON:  None. FINDINGS: Brain: No evidence of acute infarction, hemorrhage, hydrocephalus, extra-axial collection or mass lesion/mass effect. Prominence of the ventricles and sulci reflects mild to moderate cortical volume loss. Mild cerebellar atrophy is noted. Scattered periventricular and subcortical white matter change likely reflects small vessel ischemic microangiopathy. Chronic ischemic change is noted at the external capsule bilaterally. The brainstem and fourth ventricle are within normal limits. The cerebral hemispheres demonstrate grossly normal gray-white differentiation. No mass effect or midline shift is seen. Vascular: No hyperdense vessel or unexpected calcification. Skull: There is no evidence of fracture; visualized osseous structures are unremarkable in appearance. Sinuses/Orbits: The visualized portions of the orbits are within normal limits. The paranasal sinuses and mastoid air cells are well-aerated. Other: No significant soft tissue abnormalities are seen. IMPRESSION: 1. No evidence of traumatic intracranial injury or fracture. 2. Mild to moderate cortical volume loss and scattered small vessel ischemic microangiopathy. 3. Chronic ischemic change at the external capsule bilaterally. Electronically Signed   By: Roanna Raider M.D.   On: 09/17/2016 21:49    EKG: Independently reviewed. Sinus tach, no STEMI, no EKG for comparison  Assessment/Plan Principal Problem:   Sepsis (HCC) Active Problems:   Acute kidney injury (HCC)    Pressure injury of skin   Hypertension   Rheumatoid lung (HCC)   Elevated LFTs   Elevated troponin   Sepsis 2/2 unk, CXR shows possible pna Patient hemodynamically stable Given Vanc emergency room, will continue Given Zosyn in the emergency room, will continue Urine culture pending Blood cultures 2 pending Patient given 4L mL of fluid in the emergency room Lactic acid normal Lipase pending Duonebs qid mucinex q12 Checking ur strep and legionella  AKI Baseline Cr unk, Cr on admit 2.29 4L of normal saline given in the emergency room Gentle hydration Checking magnesium and phosphorus Bun/Cr ration >20, pre renal Getting imaging Rechecking electrolytes  Elevated trop - serial trop ordered, initial neg - prn EKG CP - prn ntg cp - asa QD - ECHO ordered for AM - tele bed, cardiac monitoring - zofran prn for nausea  Abn LFTS Likely due to low BP and sepsis Will check RUQ U/S Pharmacy consult  AMS CT head neg Possibly from illness, will monitor Ammonia neg  Rh Lung Prn albuterol  Mood/Sleep Cont elavil, restoril No SI/HI  Hypertension  When necessary hydralazine 10 mg IV as needed for severe blood pressure  HLD Hold lipitor  Glaucoma Cont timolol  Chronic pain Hold soma Cont flexeril, norco, ibuprofen, lyrica  Code Status: FULL  DVT Prophylaxis: Heparin Family Communication: no family at bedside, pt will notify pt's on her own Disposition Plan: Pending Improvement  Status: Inpt SDU  Haydee Salter, MD Family Medicine [facility] [email or web address] Password [email or web address]

## 2016-09-18 NOTE — Progress Notes (Signed)
Consulted by MD to review medications for a cause for elevated LFTs.  Prior to admission, patient was taking multiple medications with hepatotoxicity risk: atorvastatin (~2%), Elavil (rare), cyclobenzaprine (rare), Norco due to acetaminophen component (frequency not defined), and Lyrica (<1%).  It is difficult to determine which medication(s) could have caused elevated LFTs, especially when used in combination.   Royston Bekele D. Angelise Petrich, PharmD, BCPS Clinical Pharmacist 09/18/2016 3:50 PM

## 2016-09-19 DIAGNOSIS — I159 Secondary hypertension, unspecified: Secondary | ICD-10-CM

## 2016-09-19 DIAGNOSIS — M0510 Rheumatoid lung disease with rheumatoid arthritis of unspecified site: Secondary | ICD-10-CM

## 2016-09-19 DIAGNOSIS — R748 Abnormal levels of other serum enzymes: Secondary | ICD-10-CM

## 2016-09-19 LAB — COMPREHENSIVE METABOLIC PANEL
ALT: 465 U/L — AB (ref 14–54)
AST: 99 U/L — ABNORMAL HIGH (ref 15–41)
Albumin: 2.3 g/dL — ABNORMAL LOW (ref 3.5–5.0)
Alkaline Phosphatase: 98 U/L (ref 38–126)
Anion gap: 6 (ref 5–15)
BUN: 20 mg/dL (ref 6–20)
CHLORIDE: 109 mmol/L (ref 101–111)
CO2: 24 mmol/L (ref 22–32)
CREATININE: 0.85 mg/dL (ref 0.44–1.00)
Calcium: 8.2 mg/dL — ABNORMAL LOW (ref 8.9–10.3)
GFR calc non Af Amer: >60 mL/min (ref >60)
Glucose, Bld: 90 mg/dL (ref 65–99)
POTASSIUM: 3.7 mmol/L (ref 3.5–5.1)
Sodium: 139 mmol/L (ref 135–145)
Total Bilirubin: 1.5 mg/dL — ABNORMAL HIGH (ref 0.3–1.2)
Total Protein: 4.8 g/dL — ABNORMAL LOW (ref 6.5–8.1)

## 2016-09-19 LAB — CBC
HCT: 34.5 % — ABNORMAL LOW (ref 36.0–46.0)
Hemoglobin: 11.2 g/dL — ABNORMAL LOW (ref 12.0–15.0)
MCH: 31.1 pg (ref 26.0–34.0)
MCHC: 32.5 g/dL (ref 30.0–36.0)
MCV: 95.8 fL (ref 78.0–100.0)
PLATELETS: 118 10*3/uL — AB (ref 150–400)
RBC: 3.6 MIL/uL — AB (ref 3.87–5.11)
RDW: 13 % (ref 11.5–15.5)
WBC: 7.2 10*3/uL (ref 4.0–10.5)

## 2016-09-19 LAB — URINE CULTURE: Culture: NO GROWTH

## 2016-09-19 LAB — PROTIME-INR
INR: 1.02
Prothrombin Time: 13.4 seconds (ref 11.4–15.2)

## 2016-09-19 LAB — STREP PNEUMONIAE URINARY ANTIGEN: STREP PNEUMO URINARY ANTIGEN: NEGATIVE

## 2016-09-19 MED ORDER — DEXTROSE 5 % IV SOLN
2.0000 g | INTRAVENOUS | Status: DC
  Administered 2016-09-19 – 2016-09-20 (×2): 2 g via INTRAVENOUS
  Filled 2016-09-19 (×3): qty 2

## 2016-09-19 MED ORDER — HYDRALAZINE HCL 20 MG/ML IJ SOLN
10.0000 mg | Freq: Once | INTRAMUSCULAR | Status: AC
  Administered 2016-09-19: 10 mg via INTRAVENOUS
  Filled 2016-09-19: qty 1

## 2016-09-19 MED ORDER — FUROSEMIDE 10 MG/ML IJ SOLN
20.0000 mg | Freq: Once | INTRAMUSCULAR | Status: AC
  Administered 2016-09-20: 20 mg via INTRAVENOUS
  Filled 2016-09-19: qty 2

## 2016-09-19 MED ORDER — IPRATROPIUM-ALBUTEROL 0.5-2.5 (3) MG/3ML IN SOLN: 3.0000 mL | RESPIRATORY_TRACT | Status: DC | PRN

## 2016-09-19 MED ORDER — LEVOFLOXACIN IN D5W 750 MG/150ML IV SOLN: 750.0000 mg | INTRAVENOUS | Status: DC

## 2016-09-19 MED ORDER — MUPIROCIN CALCIUM 2 % EX CREA
TOPICAL_CREAM | Freq: Every day | CUTANEOUS | Status: DC
  Administered 2016-09-19 – 2016-09-20 (×2): via TOPICAL
  Administered 2016-09-21: 1 via TOPICAL
  Filled 2016-09-19 (×2): qty 15

## 2016-09-19 NOTE — Progress Notes (Signed)
PHARMACY NOTE:  ANTIMICROBIAL RENAL DOSAGE ADJUSTMENT  Current antimicrobial regimen includes a mismatch between antimicrobial dosage and estimated renal function.  As per policy approved by the Pharmacy & Therapeutics and Medical Executive Committees, the antimicrobial dosage will be adjusted accordingly.  Current antimicrobial dosage:  Levaquin 500mg  IV Q24H  Indication: PNA  Renal Function:  Estimated Creatinine Clearance: 39 mL/min (by C-G formula based on SCr of 0.85 mg/dL). []      On intermittent HD, scheduled: []      On CRRT    Antimicrobial dosage has been changed to:  Levaquin 750mg  IV Q48H  Additional comments: start Levaquin tomorrow since patient received azithromycin today.    Machell Wirthlin D. , PharmD, BCPS Pager:  [phone] 09/19/2016, 12:23 PM

## 2016-09-19 NOTE — Progress Notes (Signed)
Due to sensitivities to E coli in the area, pharmacy rec Ceftriaxone. Will start ceftriaxone instead of Levaquin pending sensitivities.

## 2016-09-19 NOTE — Progress Notes (Signed)
Pt transferred to 3W, all pt belongings transferred with pt. Sister  Britta Mccreedy called to notify of transfer, no answer message left for a return call. Report given to [employer] prior to transfer

## 2016-09-19 NOTE — Progress Notes (Signed)
PROGRESS NOTE    Allison Mendez  WVP:[id number] DOB: [birth year] DOA: 09/17/2016 PCP: Pcp Not In System   Brief Narrative:  76 year old female with past medical history of hypertension, peripheral vascular disease, rheumatoid arthritis, rheumatoid lung came to the ER with complaints of fever and nonproductive cough. On the chest x-ray she was found to have pneumonia. On her labs she was noted to have elevated troponin, creatinine and LFTs.   Assessment & Plan:   Principal Problem:   Sepsis ([place]) Active Problems:   Acute kidney injury ([place])   Pressure injury of skin   Hypertension   Rheumatoid lung ([facility])   Elevated LFTs   Elevated troponin  Bibasilar community acquired pneumonia -At this time she is improving. Provide supplemental oxygen -Sputum culture is pending -Blood cultures growing Escherichia coli pending sensitivities -Was given vancomycin, Zosyn and azithromycin. I'll change it over to Levaquin pending sensitivities. She is on daily dose of prednisone 5 mg orally for her rheumatoid disease. -DuoNeb's when necessary and Mucinex when necessary  Transaminitis; improving  -Likely secondary to shock liver but at this time they're nicely trending down. -Right upper quadrant ultrasound is negative. We'll continue to trend at this time Hepatic Function Latest Ref Rng & Units 09/19/2016 09/18/2016 09/17/2016  Total Protein 6.5 - 8.1 g/dL 4.8(L) 5.3(L) 5.4(L)  Albumin 3.5 - 5.0 g/dL 2.3(L) 2.7(L) 2.7(L)  AST 15 - 41 U/L 99(H) 250(H) 355(H)  ALT 14 - 54 U/L 465(H) 707(H) 837(H)  Alk Phosphatase 38 - 126 U/L 98 109 115  Total Bilirubin 0.3 - 1.2 mg/dL 1.5(H) 2.3(H) 2.7(H)  Bilirubin, Direct 0.1 - 0.5 mg/dL - 1.4(H) -     Elevated troponin; improving -Likely demand ischemia. Troponins were trended down -Pending echocardiogram. If remarkably abnormal then she may need further workup otherwise she can follow-up outpatient  Acute kidney injury; improving  -Likely prerenal in nature as  it has improved with IV fluids. Decrease IV fluids to 75 mL per hour and eventually can be stopped as her oral intake approaches normal. Lab Results  Component Value Date   CREATININE 0.85 09/19/2016   CREATININE 1.52 (H) 09/18/2016   CREATININE 2.29 (H) 09/17/2016    Hypertension -Not medications at home. Slightly high blood pressure but will continue to monitor at this time. If remains persistently elevated consider adding antihypertensive. I've asked patient to bring her previous home drugs and she has some bottles lying around at home.  Peripheral vascular disease status post right above-the-knee amputation Left lower extremity heel wound -Stable at this time. Consult wound care  Rheumatoid arthritis/rheumatoid lung -Continue prednisone 5 mg daily   DVT prophylaxis: Heparin subcutaneous Code Status: Full code Family Communication: Sister is at bedside   Disposition Plan: Patient can be transferred to [place]  Consultants:   None  Procedures:   None  Antimicrobials:   Vanc, Zosyn and Azithro on 4/12 - 4/13  Levaquin 4/13   Subjective: No complaints. Feels much better.   Objective: Vitals:   09/18/16 1554 09/18/16 1955 09/19/16 0015 09/19/16 0345  BP:  (!) 142/60 133/85 (!) 148/88  Pulse:  (!) 112 (!) 111 98  Resp:  (!) '23 20 17  ' Temp: 99.3 F (37.4 C) 99.1 F (37.3 C) 98.7 F (37.1 C) 98.5 F (36.9 C)  TempSrc: Oral Oral Oral Oral  SpO2:  96% 97% 99%  Weight:    42.5 kg (93 lb 11.1 oz)  Height:        Intake/Output Summary (Last 24 hours) at 09/19/16  1201 Last data filed at 09/19/16 1000  Gross per 24 hour  Intake             3330 ml  Output                0 ml  Net             3330 ml   Filed Weights   09/18/16 0300 09/19/16 0345  Weight: 41.5 kg (91 lb 6.4 oz) 42.5 kg (93 lb 11.1 oz)    Examination:  General exam: Appears calm and comfortable  Respiratory system: Clear to auscultation. Respiratory effort normal. Cardiovascular system: S1  & S2 heard, RRR. No JVD, murmurs, rubs, gallops or clicks. No pedal edema. Gastrointestinal system: Abdomen is nondistended, soft and nontender. No organomegaly or masses felt. Normal bowel sounds heard. Central nervous system: Alert and oriented. No focal neurological deficits. Extremities: Symmetric 5 x 5 power.Right lower leg above-the-knee amputation. Left lower heel wound noted dressing in place. Skin: As noted above Psychiatry: Judgement and insight appear normal. Mood & affect appropriate.     Data Reviewed:   CBC:  Recent Labs Lab 09/17/16 2221 09/18/16 0729 09/19/16 0320  WBC 11.0* 10.6* 7.2  NEUTROABS 9.6*  --   --   HGB 13.1 13.0 11.2*  HCT 39.7 39.1 34.5*  MCV 97.3 96.8 95.8  PLT 134* 131* 591*   Basic Metabolic Panel:  Recent Labs Lab 09/17/16 2200 09/18/16 0729 09/19/16 0320  NA 135 141 139  K 3.4* 3.7 3.7  CL 105 110 109  CO2 25 18* 24  GLUCOSE 91 85 90  BUN 49* 35* 20  CREATININE 2.29* 1.52* 0.85  CALCIUM 7.6* 7.8* 8.2*  MG  --  1.8  --   PHOS  --  3.6  --    GFR: Estimated Creatinine Clearance: 39 mL/min (by C-G formula based on SCr of 0.85 mg/dL). Liver Function Tests:  Recent Labs Lab 09/17/16 2200 09/18/16 0729 09/19/16 0320  AST 355* 250* 99*  ALT 837* 707* 465*  ALKPHOS 115 109 98  BILITOT 2.7* 2.3* 1.5*  PROT 5.4* 5.3* 4.8*  ALBUMIN 2.7* 2.7* 2.3*    Recent Labs Lab 09/18/16 0729  LIPASE 22    Recent Labs Lab 09/18/16 0005  AMMONIA 13   Coagulation Profile:  Recent Labs Lab 09/18/16 0729 09/19/16 0320  INR 1.05 1.02   Cardiac Enzymes:  Recent Labs Lab 09/17/16 2200 09/18/16 1238 09/18/16 1849  TROPONINI 0.39* 0.03* 0.16*   BNP (last 3 results) No results for input(s): PROBNP in the last 8760 hours. HbA1C: No results for input(s): HGBA1C in the last 72 hours. CBG: No results for input(s): GLUCAP in the last 168 hours. Lipid Profile: No results for input(s): CHOL, HDL, LDLCALC, TRIG, CHOLHDL, LDLDIRECT  in the last 72 hours. Thyroid Function Tests: No results for input(s): TSH, T4TOTAL, FREET4, T3FREE, THYROIDAB in the last 72 hours. Anemia Panel: No results for input(s): VITAMINB12, FOLATE, FERRITIN, TIBC, IRON, RETICCTPCT in the last 72 hours. Sepsis Labs:  Recent Labs Lab 09/17/16 2235  LATICACIDVEN 0.93    Recent Results (from the past 240 hour(s))  Blood culture (routine x 2)     Status: None (Preliminary result)   Collection Time: 09/17/16  9:49 PM  Result Value Ref Range Status   Specimen Description BLOOD LEFT HAND  Final   Special Requests   Final    BOTTLES DRAWN AEROBIC AND ANAEROBIC Blood Culture results may not be optimal due to an  inadequate volume of blood received in culture bottles   Culture   Final    NO GROWTH 1 DAY Performed at [facility], [place] [address] [place], [place] [address]    Report Status PENDING  Incomplete  Urine culture     Status: None   Collection Time: 09/17/16 10:16 PM  Result Value Ref Range Status   Specimen Description URINE, CATHETERIZED  Final   Special Requests NONE  Final   Culture   Final    NO GROWTH Performed at [facility], [address] [place], [place] [address]    Report Status 09/19/2016 FINAL  Final  Blood culture (routine x 2)     Status: Abnormal (Preliminary result)   Collection Time: 09/17/16 10:55 PM  Result Value Ref Range Status   Specimen Description BLOOD LEFT WRIST  Final   Special Requests BOTTLES DRAWN AEROBIC AND ANAEROBIC BCAV  Final   Culture  Setup Time   Final    GRAM NEGATIVE RODS AEROBIC BOTTLE ONLY Organism ID to follow CRITICAL RESULT CALLED TO, READ BACK BY AND VERIFIED WITH: A JOHNSTON PHARMD 2050 09/18/16 A BROWNING Performed at [facility], [place] [address] [place], [place] [address]    Culture ESCHERICHIA COLI (A)  Final   Report Status PENDING  Incomplete  Blood Culture ID Panel (Reflexed)     Status: Abnormal   Collection Time: 09/17/16 10:55 PM  Result Value Ref  Range Status   Enterococcus species NOT DETECTED NOT DETECTED Final   Listeria monocytogenes NOT DETECTED NOT DETECTED Final   Staphylococcus species NOT DETECTED NOT DETECTED Final   Staphylococcus aureus NOT DETECTED NOT DETECTED Final   Streptococcus species NOT DETECTED NOT DETECTED Final   Streptococcus agalactiae NOT DETECTED NOT DETECTED Final   Streptococcus pneumoniae NOT DETECTED NOT DETECTED Final   Streptococcus pyogenes NOT DETECTED NOT DETECTED Final   Acinetobacter baumannii NOT DETECTED NOT DETECTED Final   Enterobacteriaceae species DETECTED (A) NOT DETECTED Final    Comment: Enterobacteriaceae represent a large family of gram-negative bacteria, not a single organism. CRITICAL RESULT CALLED TO, READ BACK BY AND VERIFIED WITH: A JOHNSTON PHARMD 2050 09/18/16 A BROWNING    Enterobacter cloacae complex NOT DETECTED NOT DETECTED Final   Escherichia coli DETECTED (A) NOT DETECTED Final    Comment: CRITICAL RESULT CALLED TO, READ BACK BY AND VERIFIED WITH: A JOHNSTON PHARMD 2050 09/18/16 A BROWNING    Klebsiella oxytoca NOT DETECTED NOT DETECTED Final   Klebsiella pneumoniae NOT DETECTED NOT DETECTED Final   Proteus species NOT DETECTED NOT DETECTED Final   Serratia marcescens NOT DETECTED NOT DETECTED Final   Carbapenem resistance NOT DETECTED NOT DETECTED Final   Haemophilus influenzae NOT DETECTED NOT DETECTED Final   Neisseria meningitidis NOT DETECTED NOT DETECTED Final   Pseudomonas aeruginosa NOT DETECTED NOT DETECTED Final   Candida albicans NOT DETECTED NOT DETECTED Final   Candida glabrata NOT DETECTED NOT DETECTED Final   Candida krusei NOT DETECTED NOT DETECTED Final   Candida parapsilosis NOT DETECTED NOT DETECTED Final   Candida tropicalis NOT DETECTED NOT DETECTED Final    Comment: Performed at [facility], [place] [address] [place], [place] [address]  MRSA PCR Screening     Status: None   Collection Time: 09/18/16  3:08 AM  Result Value Ref Range  Status   MRSA by PCR NEGATIVE NEGATIVE Final    Comment:        The GeneXpert MRSA  Assay (FDA approved for NASAL specimens only), is one component of a comprehensive MRSA colonization surveillance program. It is not intended to diagnose MRSA infection nor to guide or monitor treatment for MRSA infections.          Radiology Studies: Dg Chest 2 View  Result Date: 09/17/2016 CLINICAL DATA:  Acute onset of confusion and lethargy. Status post fall out of bed 2 days ago. Initial encounter. EXAM: CHEST  2 VIEW COMPARISON:  None. FINDINGS: The lungs are well-aerated. Mild bibasilar opacities raise question for mild pneumonia. There is no evidence of pleural effusion or pneumothorax. The heart is borderline normal in size. No acute osseous abnormalities are seen. IMPRESSION: Mild bibasilar opacities raise question for mild pneumonia. Electronically Signed   By: Garald Balding M.D.   On: 09/17/2016 21:48   Ct Head Wo Contrast  Result Date: 09/17/2016 CLINICAL DATA:  Status post fall 2 days ago, with injury to the head. Confusion. Initial encounter. EXAM: CT HEAD WITHOUT CONTRAST TECHNIQUE: Contiguous axial images were obtained from the base of the skull through the vertex without intravenous contrast. COMPARISON:  None. FINDINGS: Brain: No evidence of acute infarction, hemorrhage, hydrocephalus, extra-axial collection or mass lesion/mass effect. Prominence of the ventricles and sulci reflects mild to moderate cortical volume loss. Mild cerebellar atrophy is noted. Scattered periventricular and subcortical white matter change likely reflects small vessel ischemic microangiopathy. Chronic ischemic change is noted at the external capsule bilaterally. The brainstem and fourth ventricle are within normal limits. The cerebral hemispheres demonstrate grossly normal gray-white differentiation. No mass effect or midline shift is seen. Vascular: No hyperdense vessel or unexpected calcification. Skull: There  is no evidence of fracture; visualized osseous structures are unremarkable in appearance. Sinuses/Orbits: The visualized portions of the orbits are within normal limits. The paranasal sinuses and mastoid air cells are well-aerated. Other: No significant soft tissue abnormalities are seen. IMPRESSION: 1. No evidence of traumatic intracranial injury or fracture. 2. Mild to moderate cortical volume loss and scattered small vessel ischemic microangiopathy. 3. Chronic ischemic change at the external capsule bilaterally. Electronically Signed   By: Garald Balding M.D.   On: 09/17/2016 21:49   Dg Hip Port Unilat With Pelvis 1v Left  Result Date: 09/18/2016 CLINICAL DATA:  77 y/o F; fall 2 days ago with lateral left hip pain. EXAM: DG HIP (WITH OR WITHOUT PELVIS) 1V PORT LEFT COMPARISON:  None. FINDINGS: There is no evidence of hip fracture or dislocation. There is no evidence of arthropathy or other focal bone abnormality. Extensive vascular calcification. IMPRESSION: No acute fracture or dislocation identified. Electronically Signed   By: Kristine Garbe M.D.   On: 09/18/2016 20:59   US Abdomen Limited Ruq  Result Date: 09/18/2016 CLINICAL DATA:  Elevated LFTs. EXAM: US ABDOMEN LIMITED - RIGHT UPPER QUADRANT COMPARISON:  None FINDINGS: Gallbladder: No gallstones, wall thickening, pericholecystic fluid or sonographic Murphy sign. Common bile duct: Diameter: 5.7 mm Liver: Normal echogenicity without focal lesion or biliary dilatation. IMPRESSION: Unremarkable right upper quadrant ultrasound examination. Electronically Signed   By: Marijo Sanes M.D.   On: 09/18/2016 19:01        Scheduled Meds: . amitriptyline  20 mg Oral QHS  . aspirin  325 mg Oral Daily  . azithromycin  250 mg Intravenous Q24H  . cyclobenzaprine  10 mg Oral Daily  . guaiFENesin  600 mg Oral BID  . heparin  5,000 Units Subcutaneous Q8H  . piperacillin-tazobactam (ZOSYN)  IV  3.375 g Intravenous Q8H  . predniSONE  5 mg Oral  Daily  . pregabalin  75 mg Oral BID  . sodium chloride flush  3 mL Intravenous Q12H  . timolol  1 drop Both Eyes [id number]  . vancomycin  500 mg Intravenous Q48H   Continuous Infusions: . sodium chloride 75 mL/hr at 09/19/16 1111     LOS: 1 day    Time spent: 35 mins     Jahnavi Muratore Arsenio Loader, MD [facility] Pager [phone]   If 7PM-7AM, please contact night-coverage [email or web address] Password [email or web address] 09/19/2016, 12:01 PM

## 2016-09-19 NOTE — Consult Note (Addendum)
WOC Nurse wound consult note Reason for Consult: WOC consult performed on 4/12; now requested to assess left leg. Wound type: Left heel with stage 1 pressure injury; 4X4cm red and non-blanching Left inner ankle with full thickness stasis ulcer; pt's sister states it was present on admission and she was using antibiotic ointment. Measurement: .3X.3X.2cm Wound bed: yellow moist wound bed, small amt yellow drainage, no odor Periwound: intact skin surrounding Dressing procedure/placement/frequency: Bactroban to promote moist healing.  Foam dressing to protect heel, float to reduce pressure. Discussed plan of care with patient and sister. Please re-consult if further assistance is needed.  Thank-you,  Cammie Mcgee MSN, RN, CWOCN, [place], CNS [phone]

## 2016-09-19 NOTE — Progress Notes (Signed)
Pharmacy Antibiotic Note  Allison Mendez is a 76 y.o. female admitted on 09/17/2016 with E.coli bacteremia.  Pharmacy has been consulted originally for vancomycin and zosyn dosing.  In light of Ecoli bacteremia, asked to change to ceftriaxone for now.  May be able to further narrow once sensitivities back.    Plan: 1. Ceftriaxone 2g IV q 24 hrs 2. F/u Ecoli sensitivities   Height: 5\' 7"  (170.2 cm) Weight: 93 lb 11.1 oz (42.5 kg) IBW/kg (Calculated) : 61.6  Temp (24hrs), Avg:98.8 F (37.1 C), Min:98.4 F (36.9 C), Max:99.3 F (37.4 C)   Recent Labs Lab 09/17/16 2200 09/17/16 2221 09/17/16 2235 09/18/16 0729 09/19/16 0320  WBC  --  11.0*  --  10.6* 7.2  CREATININE 2.29*  --   --  1.52* 0.85  LATICACIDVEN  --   --  0.93  --   --     Estimated Creatinine Clearance: 39 mL/min (by C-G formula based on SCr of 0.85 mg/dL).    No Known Allergies  Antimicrobials this admission:  4/11 zosyn > 4/13 4/11 vanc >4/13 4/13 Ceftriaxone>  Dose adjustments this admission:   Microbiology results:  4/11 BCx x 2 > E. Coli 4/11 UCx > neg 4/12 MRSA PCR neg  6/12, BCPS  Clinical Pharmacist Pager [phone]  09/19/2016 1:05 PM

## 2016-09-20 ENCOUNTER — Inpatient Hospital Stay (HOSPITAL_COMMUNITY): Payer: Medicare (Managed Care)

## 2016-09-20 DIAGNOSIS — R7989 Other specified abnormal findings of blood chemistry: Secondary | ICD-10-CM

## 2016-09-20 DIAGNOSIS — A4151 Sepsis due to Escherichia coli [E. coli]: Principal | ICD-10-CM

## 2016-09-20 DIAGNOSIS — R000 Tachycardia, unspecified: Secondary | ICD-10-CM

## 2016-09-20 DIAGNOSIS — N390 Urinary tract infection, site not specified: Secondary | ICD-10-CM

## 2016-09-20 LAB — ECHOCARDIOGRAM COMPLETE
CHL CUP DOP CALC LVOT VTI: 18.7 cm
E/e' ratio: 10.36
EWDT: 182 ms
FS: 45 % — AB (ref 28–44)
Height: 67 in
IV/PV OW: 1.16
LA ID, A-P, ES: 31 mm
LA diam index: 2.12 cm/m2
LA vol index: 17.2 mL/m2
LAVOL: 25.1 mL
LAVOLA4C: 23.1 mL
LEFT ATRIUM END SYS DIAM: 31 mm
LV E/e' medial: 10.36
LV PW d: 7.87 mm — AB (ref 0.6–1.1)
LV TDI E'MEDIAL: 5.57
LV e' LATERAL: 6.6 cm/s
LVEEAVG: 10.36
LVOT area: 2.01 cm2
LVOTD: 16 mm
LVOTPV: 91.7 cm/s
LVOTSV: 38 mL
Lateral S' vel: 14.8 cm/s
MV Dec: 182
MV pk E vel: 68.4 m/s
MVPKAVEL: 90.3 m/s
RV TAPSE: 16.3 mm
TDI e' lateral: 6.6
Weight: 1492.8 oz

## 2016-09-20 MED ORDER — PREDNISONE 1 MG PO TABS: 2.0000 mg | ORAL_TABLET | Freq: Every day | ORAL | Status: DC

## 2016-09-20 MED ORDER — ORAL CARE MOUTH RINSE
15.0000 mL | Freq: Two times a day (BID) | OROMUCOSAL | Status: DC
  Administered 2016-09-20 – 2016-09-21 (×3): 15 mL via OROMUCOSAL

## 2016-09-20 MED ORDER — HYDROCORTISONE NA SUCCINATE PF 100 MG IJ SOLR
50.0000 mg | Freq: Three times a day (TID) | INTRAMUSCULAR | Status: DC
  Administered 2016-09-20 – 2016-09-21 (×3): 50 mg via INTRAVENOUS
  Filled 2016-09-20 (×3): qty 2

## 2016-09-20 MED ORDER — PREDNISONE 1 MG PO TABS
3.0000 mg | ORAL_TABLET | Freq: Every day | ORAL | Status: DC
  Administered 2016-09-20: 3 mg via ORAL
  Filled 2016-09-20: qty 3

## 2016-09-20 MED ORDER — ASPIRIN EC 81 MG PO TBEC
81.0000 mg | DELAYED_RELEASE_TABLET | Freq: Every day | ORAL | Status: DC
  Administered 2016-09-20 – 2016-09-21 (×2): 81 mg via ORAL
  Filled 2016-09-20 (×2): qty 1

## 2016-09-20 MED ORDER — METOPROLOL TARTRATE 12.5 MG HALF TABLET
12.5000 mg | ORAL_TABLET | Freq: Two times a day (BID) | ORAL | Status: DC
  Administered 2016-09-20 (×2): 12.5 mg via ORAL
  Filled 2016-09-20 (×2): qty 1

## 2016-09-20 MED ORDER — GUAIFENESIN ER 600 MG PO TB12: 600.0000 mg | ORAL_TABLET | Freq: Two times a day (BID) | ORAL | Status: DC | PRN

## 2016-09-20 NOTE — Progress Notes (Signed)
  Echocardiogram 2D Echocardiogram has been performed.  Delcie Roch 09/20/2016, 3:33 PM

## 2016-09-20 NOTE — Progress Notes (Signed)
[place] TEAM 1 - Stepdown/ICU TEAM  Tabaitha Seedorf  ONG:[id number] DOB: [birth year] DOA: 09/17/2016 PCP: Pcp Not In System    Brief Narrative:  76 year old female with history of hypertension, peripheral vascular disease, rheumatoid arthritis, and rheumatoid lung who came to the ER with complaints of fever and nonproductive cough. On the chest x-ray she was found to have pneumonia. On her labs she was noted to have elevated troponin, creatinine and LFTs.  Subjective: Pt is very sleepy, but I am able to awaken her.  She tells me she wants to be d/c asap.  She denies cp, sob, n/v, or abdom pain.   Assessment & Plan:  Bibasilar community acquired pneumonia v/s UTI CXR suggests possible B basilar opacities - UA abnormal suggestive of UTI, but urine culture w/o growth - blood cultures growing E coli pending sensitivities - at this point I am not convinced we know which of the 2 is the true source of her infection - cont directed abx and follow clinically   Transaminitis -Likely secondary to shock liver  -Right upper quadrant ultrasound is negative -LFTs cont to improve   Elevated troponin -Likely demand ischemia and tachycardia/rate related  -no chest pain  -TTE pending   Recent Labs Lab 09/17/16 2200 09/18/16 1238 09/18/16 1849  TROPONINI 0.39* 0.03* 0.16*    Acute kidney injury -Likely prerenal in nature - resolved with IV fluids  Recent Labs Lab 09/17/16 2200 09/18/16 0729 09/19/16 0320  CREATININE 2.29* 1.52* 0.85    Hypertension Not on medical tx at home - BP remains signif elevated - begin tx and follow   Peripheral vascular disease status post right above-the-knee amputation + Left heel wound (stage 1) -Stable at this time - care as per WOC team   Rheumatoid arthritis/rheumatoid lung -short course of stress dose steroids given acute infection and persisting tachycardia   DVT prophylaxis: SQ heparin  Code Status: FULL CODE Family Communication: no family  present at time of exam  Disposition Plan: home when clinically stalbe   Consultants:  none  Procedures: none  Antimicrobials:  Vanc + Zosyn + Azithro 4/12 > 4/13 Rocephin 4/13 >   Objective: Blood pressure (!) 170/98, pulse (!) 112, temperature 98.2 F (36.8 C), temperature source Oral, resp. rate (!) 28, height 5\' 7"  (1.702 m), weight 42.3 kg (93 lb 4.8 oz), SpO2 97 %.  Intake/Output Summary (Last 24 hours) at 09/20/16 1026 Last data filed at 09/20/16 0848  Gross per 24 hour  Intake          1288.75 ml  Output             3001 ml  Net         -1712.25 ml   Filed Weights   09/19/16 0345 09/19/16 2149 09/20/16 0300  Weight: 42.5 kg (93 lb 11.1 oz) 45.1 kg (99 lb 6.4 oz) 42.3 kg (93 lb 4.8 oz)    Examination: General: No acute respiratory distress Lungs: Mild bibasilar crackles with no wheezing Cardiovascular: Tachycardic but regular with no appreciable murmur or gallop Abdomen: Nontender, nondistended, soft, bowel sounds positive, no rebound, no ascites, no appreciable mass Extremities: No significant cyanosis, clubbing, or edema bilateral lower extremities  CBC:  Recent Labs Lab 09/17/16 2221 09/18/16 0729 09/19/16 0320  WBC 11.0* 10.6* 7.2  NEUTROABS 9.6*  --   --   HGB 13.1 13.0 11.2*  HCT 39.7 39.1 34.5*  MCV 97.3 96.8 95.8  PLT 134* 131* 118*   Basic Metabolic Panel:  Recent Labs Lab 09/17/16 2200 09/18/16 0729 09/19/16 0320  NA 135 141 139  K 3.4* 3.7 3.7  CL 105 110 109  CO2 25 18* 24  GLUCOSE 91 85 90  BUN 49* 35* 20  CREATININE 2.29* 1.52* 0.85  CALCIUM 7.6* 7.8* 8.2*  MG  --  1.8  --   PHOS  --  3.6  --    GFR: Estimated Creatinine Clearance: 38.8 mL/min (by C-G formula based on SCr of 0.85 mg/dL).  Liver Function Tests:  Recent Labs Lab 09/17/16 2200 09/18/16 0729 09/19/16 0320  AST 355* 250* 99*  ALT 837* 707* 465*  ALKPHOS 115 109 98  BILITOT 2.7* 2.3* 1.5*  PROT 5.4* 5.3* 4.8*  ALBUMIN 2.7* 2.7* 2.3*    Recent  Labs Lab 09/18/16 0729  LIPASE 22    Recent Labs Lab 09/18/16 0005  AMMONIA 13    Coagulation Profile:  Recent Labs Lab 09/18/16 0729 09/19/16 0320  INR 1.05 1.02    Cardiac Enzymes:  Recent Labs Lab 09/17/16 2200 09/18/16 1238 09/18/16 1849  TROPONINI 0.39* 0.03* 0.16*    Recent Results (from the past 240 hour(s))  Blood culture (routine x 2)     Status: None (Preliminary result)   Collection Time: 09/17/16  9:49 PM  Result Value Ref Range Status   Specimen Description BLOOD LEFT HAND  Final   Special Requests   Final    BOTTLES DRAWN AEROBIC AND ANAEROBIC Blood Culture results may not be optimal due to an inadequate volume of blood received in culture bottles   Culture   Final    NO GROWTH 1 DAY Performed at [facility], [address], [place], [place] [address]    Report Status PENDING  Incomplete  Urine culture     Status: None   Collection Time: 09/17/16 10:16 PM  Result Value Ref Range Status   Specimen Description URINE, CATHETERIZED  Final   Special Requests NONE  Final   Culture   Final    NO GROWTH Performed at [facility], [address], [place], [place] [address]    Report Status 09/19/2016 FINAL  Final  Blood culture (routine x 2)     Status: Abnormal (Preliminary result)   Collection Time: 09/17/16 10:55 PM  Result Value Ref Range Status   Specimen Description BLOOD LEFT WRIST  Final   Special Requests BOTTLES DRAWN AEROBIC AND ANAEROBIC BCAV  Final   Culture  Setup Time   Final    GRAM NEGATIVE RODS AEROBIC BOTTLE ONLY Organism ID to follow CRITICAL RESULT CALLED TO, READ BACK BY AND VERIFIED WITH: A JOHNSTON PHARMD 2050 09/18/16 A BROWNING    Culture (A)  Final    ESCHERICHIA COLI REPEATING SENSITIVITIES Performed at [facility], [address], [place], [place] [address]    Report Status PENDING  Incomplete  Blood Culture ID Panel (Reflexed)     Status: Abnormal   Collection Time: 09/17/16 10:55 PM  Result  Value Ref Range Status   Enterococcus species NOT DETECTED NOT DETECTED Final   Listeria monocytogenes NOT DETECTED NOT DETECTED Final   Staphylococcus species NOT DETECTED NOT DETECTED Final   Staphylococcus aureus NOT DETECTED NOT DETECTED Final   Streptococcus species NOT DETECTED NOT DETECTED Final   Streptococcus agalactiae NOT DETECTED NOT DETECTED Final   Streptococcus pneumoniae NOT DETECTED NOT DETECTED Final   Streptococcus pyogenes NOT DETECTED NOT DETECTED Final   Acinetobacter baumannii NOT DETECTED NOT DETECTED  Final   Enterobacteriaceae species DETECTED (A) NOT DETECTED Final    Comment: Enterobacteriaceae represent a large family of gram-negative bacteria, not a single organism. CRITICAL RESULT CALLED TO, READ BACK BY AND VERIFIED WITH: A JOHNSTON PHARMD 2050 09/18/16 A BROWNING    Enterobacter cloacae complex NOT DETECTED NOT DETECTED Final   Escherichia coli DETECTED (A) NOT DETECTED Final    Comment: CRITICAL RESULT CALLED TO, READ BACK BY AND VERIFIED WITH: A JOHNSTON PHARMD 2050 09/18/16 A BROWNING    Klebsiella oxytoca NOT DETECTED NOT DETECTED Final   Klebsiella pneumoniae NOT DETECTED NOT DETECTED Final   Proteus species NOT DETECTED NOT DETECTED Final   Serratia marcescens NOT DETECTED NOT DETECTED Final   Carbapenem resistance NOT DETECTED NOT DETECTED Final   Haemophilus influenzae NOT DETECTED NOT DETECTED Final   Neisseria meningitidis NOT DETECTED NOT DETECTED Final   Pseudomonas aeruginosa NOT DETECTED NOT DETECTED Final   Candida albicans NOT DETECTED NOT DETECTED Final   Candida glabrata NOT DETECTED NOT DETECTED Final   Candida krusei NOT DETECTED NOT DETECTED Final   Candida parapsilosis NOT DETECTED NOT DETECTED Final   Candida tropicalis NOT DETECTED NOT DETECTED Final    Comment: Performed at [facility], [address], [place], [place] [address]  MRSA PCR Screening     Status: None   Collection Time: 09/18/16  3:08 AM  Result Value Ref  Range Status   MRSA by PCR NEGATIVE NEGATIVE Final    Comment:        The GeneXpert MRSA Assay (FDA approved for NASAL specimens only), is one component of a comprehensive MRSA colonization surveillance program. It is not intended to diagnose MRSA infection nor to guide or monitor treatment for MRSA infections.      Scheduled Meds: . amitriptyline  20 mg Oral QHS  . aspirin  325 mg Oral Daily  . cefTRIAXone (ROCEPHIN)  IV  2 g Intravenous Q24H  . cyclobenzaprine  10 mg Oral Daily  . guaiFENesin  600 mg Oral BID  . heparin  5,000 Units Subcutaneous Q8H  . mouth rinse  15 mL Mouth Rinse BID  . mupirocin cream   Topical Daily  . predniSONE  3 mg Oral Q breakfast   And  . predniSONE  2 mg Oral QAC supper  . pregabalin  75 mg Oral BID  . sodium chloride flush  3 mL Intravenous Q12H  . timolol  1 drop Both Eyes [id number]     LOS: 2 days   Lonia Blood, MD [facility] Office  [phone] Pager - Text Page per Loretha Stapler as per below:  On-Call/Text Page:      [email or web address]      password [email or web address]  If 7PM-7AM, please contact night-coverage [email or web address] Password [email or web address] 09/20/2016, 10:26 AM

## 2016-09-20 NOTE — Evaluation (Signed)
Physical Therapy Evaluation Patient Details Name: Allison Mendez MRN: [id number] DOB: June 04, 1941 Today's Date: 09/20/2016   History of Present Illness  Patient is a 76 yo female admitted 09/17/16 with AMS.  Patient had fall 2 days pta.  Patient also with fever, cough.  Patient with sepsis, CAP vs UTI; HTN; AKI.     PMH:  HTN, PVD, Rt AKA, RA/Rheumatoid lung, wound Lt heel/ankle, chronic pain  Clinical Impression  Patient presents with problems listed below.  Will benefit from acute PT to maximize functional mobility prior to discharge.  At baseline, patient able to transfer to w/c and propel w/c on her own (per patient).  Patient reports she is going to sister's home at discharge.  Recommend HHPT at d/c for continued therapy to reach PLOF.    Follow Up Recommendations Home health PT;Supervision for mobility/OOB    Equipment Recommendations  None recommended by PT    Recommendations for Other Services       Precautions / Restrictions Precautions Precautions: Fall Restrictions Weight Bearing Restrictions: No      Mobility  Bed Mobility Overal bed mobility: Needs Assistance Bed Mobility: Rolling;Sidelying to Sit;Sit to Supine Rolling: Min guard Sidelying to sit: Min assist;HOB elevated   Sit to supine: Min guard   General bed mobility comments: Patient using rail for rolling.  Assist to raise trunk to sitting position, and scoot forward in sitting to EOB.  Patient able to sit EOB x 8 minutes.  Patient fatigues and leaned onto Rt shoulder.  Returned to supine with no physical assist.  Transfers                 General transfer comment: Patient declined due to fatigue and SOB.  Ambulation/Gait                Stairs            Wheelchair Mobility    Modified Rankin (Stroke Patients Only)       Balance Overall balance assessment: Needs assistance Sitting-balance support: Single extremity supported Sitting balance-Leahy Scale: Poor Sitting balance -  Comments: Required UE support to maintain balance.  Patient fatigued quickly.                                     Pertinent Vitals/Pain Pain Assessment: No/denies pain    Home Living Family/patient expects to be discharged to:: Private residence Living Arrangements: Other relatives (sister's home) Available Help at Discharge: Family;Available 24 hours/day (sister) Type of Home: House Home Access: Level entry     Home Layout: Two level;Bed/bath upstairs Home Equipment: Wheelchair - manual;Shower seat Additional Comments: Patient lives in [place].  Is in [place] visiting sister.  Per patient, she will be going to sister's home at d/c.  Unable to reach sister by phone.    Prior Function Level of Independence: Independent with assistive device(s);Needs assistance   Gait / Transfers Assistance Needed: Able to transfer into/out of w/c on her own.  Bumps up/down stairs on bottom.  Requires assist to move w/c up/down stairs.  ADL's / Homemaking Assistance Needed: Patient reports she has an aide 2x/week to assist with errands/transportation in [place].        Hand Dominance        Extremity/Trunk Assessment   Upper Extremity Assessment Upper Extremity Assessment: RUE deficits/detail;LUE deficits/detail RUE Deficits / Details: Significant RA changes to hands.  Decreased shoulder ROM. RUE Coordination: decreased fine  motor LUE Deficits / Details: Significant RA changes to hands.  Decreased shoulder ROM. LUE Coordination: decreased fine motor    Lower Extremity Assessment Lower Extremity Assessment: RLE deficits/detail;LLE deficits/detail RLE Deficits / Details: Patient s/p Rt AKA LLE Deficits / Details: Strength grossly 3+/5.  Patient with wound on ankle/heel with bandages covering. LLE Coordination: decreased gross motor    Cervical / Trunk Assessment Cervical / Trunk Assessment: Kyphotic  Communication   Communication: Expressive difficulties (Somewhat lethargic and  difficult to understand at times.)  Cognition Arousal/Alertness: Lethargic Behavior During Therapy: Flat affect Overall Cognitive Status: Within Functional Limits for tasks assessed                                 General Comments: Patient slow to respond to questions.      General Comments      Exercises     Assessment/Plan    PT Assessment Patient needs continued PT services  PT Problem List Decreased strength;Decreased range of motion;Decreased activity tolerance;Decreased balance;Decreased mobility;Decreased coordination;Cardiopulmonary status limiting activity;Decreased skin integrity       PT Treatment Interventions Functional mobility training;Therapeutic activities;Therapeutic exercise;Patient/family education;Wheelchair mobility training    PT Goals (Current goals can be found in the Care Plan section)  Acute Rehab PT Goals Patient Stated Goal: To return home. PT Goal Formulation: With patient Time For Goal Achievement: 09/27/16 Potential to Achieve Goals: Good    Frequency Min 3X/week   Barriers to discharge        Co-evaluation               End of Session Equipment Utilized During Treatment: Oxygen Activity Tolerance: Patient limited by fatigue Patient left: in bed;with call bell/phone within reach;with bed alarm set Nurse Communication: Mobility status PT Visit Diagnosis: Other abnormalities of gait and mobility (R26.89);History of falling (Z91.81);Muscle weakness (generalized) (M62.81)    Time: 6213-0865 PT Time Calculation (min) (ACUTE ONLY): 22 min   Charges:   PT Evaluation $PT Eval Moderate Complexity: 1 Procedure     PT G Codes:        Durenda Hurt. Renaldo Fiddler, [facility] Acute Rehab Services Pager [phone]   Vena Austria 09/20/2016, 8:50 PM

## 2016-09-21 DIAGNOSIS — N390 Urinary tract infection, site not specified: Secondary | ICD-10-CM | POA: Diagnosis present

## 2016-09-21 LAB — COMPREHENSIVE METABOLIC PANEL
ALT: 210 U/L — AB (ref 14–54)
AST: 30 U/L (ref 15–41)
Albumin: 2.4 g/dL — ABNORMAL LOW (ref 3.5–5.0)
Alkaline Phosphatase: 100 U/L (ref 38–126)
Anion gap: 6 (ref 5–15)
BILIRUBIN TOTAL: 0.9 mg/dL (ref 0.3–1.2)
BUN: 10 mg/dL (ref 6–20)
CHLORIDE: 104 mmol/L (ref 101–111)
CO2: 32 mmol/L (ref 22–32)
Calcium: 8.1 mg/dL — ABNORMAL LOW (ref 8.9–10.3)
Creatinine, Ser: 0.59 mg/dL (ref 0.44–1.00)
GFR calc Af Amer: >60 mL/min (ref >60)
GLUCOSE: 110 mg/dL — AB (ref 65–99)
Potassium: 3.1 mmol/L — ABNORMAL LOW (ref 3.5–5.1)
Sodium: 142 mmol/L (ref 135–145)
TOTAL PROTEIN: 5.3 g/dL — AB (ref 6.5–8.1)

## 2016-09-21 LAB — CULTURE, BLOOD (ROUTINE X 2)

## 2016-09-21 LAB — CBC
HEMATOCRIT: 37.6 % (ref 36.0–46.0)
HEMOGLOBIN: 12.3 g/dL (ref 12.0–15.0)
MCH: 31.4 pg (ref 26.0–34.0)
MCHC: 32.7 g/dL (ref 30.0–36.0)
MCV: 95.9 fL (ref 78.0–100.0)
Platelets: 160 10*3/uL (ref 150–400)
RBC: 3.92 MIL/uL (ref 3.87–5.11)
RDW: 13.3 % (ref 11.5–15.5)
WBC: 7.9 10*3/uL (ref 4.0–10.5)

## 2016-09-21 LAB — TROPONIN I: Troponin I: 0.03 ng/mL (ref <0.03)

## 2016-09-21 MED ORDER — CEFUROXIME AXETIL 500 MG PO TABS: 500.0000 mg | ORAL_TABLET | Freq: Two times a day (BID) | ORAL | 0 refills | Status: DC

## 2016-09-21 MED ORDER — METOPROLOL SUCCINATE ER 25 MG PO TB24: 12.5000 mg | ORAL_TABLET | Freq: Every day | ORAL | 0 refills | Status: DC

## 2016-09-21 MED ORDER — POTASSIUM CHLORIDE CRYS ER 20 MEQ PO TBCR
20.0000 meq | EXTENDED_RELEASE_TABLET | Freq: Once | ORAL | Status: AC
  Administered 2016-09-21: 20 meq via ORAL
  Filled 2016-09-21: qty 1

## 2016-09-21 MED ORDER — METOPROLOL SUCCINATE ER 25 MG PO TB24
12.5000 mg | ORAL_TABLET | Freq: Every day | ORAL | Status: DC
  Administered 2016-09-21: 12.5 mg via ORAL
  Filled 2016-09-21: qty 1

## 2016-09-21 MED ORDER — CEFUROXIME AXETIL 500 MG PO TABS
500.0000 mg | ORAL_TABLET | Freq: Two times a day (BID) | ORAL | Status: DC
  Filled 2016-09-21: qty 1

## 2016-09-21 NOTE — Discharge Instructions (Signed)
Bacteremia  Bacteremia is the presence of bacteria in the blood. When bacteria enter the bloodstream, they can cause a life-threatening reaction called sepsis, which is a medical emergency. Bacteremia can spread to other parts of the body, including the heart, joints, and brain. What are the causes? This condition is caused by bacteria that get into the blood. Bacteria can enter the blood:  From a skin infection or a cut on your skin.  During an episode of pneumonia.  From an infection in your stomach or intestine (gastrointestinal infection).  From an infection in your bladder or urinary system (urinary tract infection).  During a dental or medical procedure.  After you brush your teeth so hard that your gums bleed.  When a bacterial infection in another part of the body spreads to the blood.  Through a dirty needle. What increases the risk? This condition is more likely to develop in:  Children.  Elderly adults.  People who have a long-lasting (chronic) disease or medical condition.  People who have an artificial joint or heart valve.  People who have heart valve disease.  People who have a tube, such as a catheter or IV tube, that has been inserted for a medical treatment.  People who have a weak body defense system (immune system).  People who use IV drugs. What are the signs or symptoms? Symptoms of this condition include:  Fever.  Chills.  A racing heart.  Shortness of breath.  Dizziness.  Weakness.  Confusion.  Nausea or vomiting.  Diarrhea. In some cases, there are no symptoms. Bacteremia that has spread to the other parts of the body may cause symptoms in those areas. How is this diagnosed? This condition may be diagnosed with a physical exam and tests, such as:  A complete blood count (CBC). This test looks for signs of infection.  Blood cultures. These look for bacteria in your blood.  Tests of any tubes that you may have inserted into  your body, such as an IV tube or urinary catheter. These tests look for a source of infection.  Urine tests including urine cultures. These look for bacteria in the urine that could be a source of infection.  Imaging tests, such as an X-ray, CT scan, MRI, or heart ultrasound. These look for a source of infection in other parts of the body, such as the lungs, heart valves, or joints. How is this treated? This condition may be treated with:  Antibiotic medicines given through an IV infusion. Depending on the source of infection, antibiotics may be needed for several weeks. At first, an antibiotic may be given to kill most types of blood bacteria. If your test results show that a certain kind of bacteria is causing problems, the antibiotic may be changed to kill only the bacteria that are causing problems.  Antibiotics taken by mouth.  IV fluids to support the body as you fight the infection.  Removing any catheter or device that could be a source of infection.  Blood pressure and breathing support, if you have sepsis.  Surgery to control the source or spread of infection, such as:  Removing an infected implanted device.  Removing infected tissue or an abscess. This condition is usually treated at a hospital. If you are treated at home, you may need to come back for medicines, blood tests, and evaluation. This is important. Follow these instructions at home:  Take over-the-counter and prescription medicines only as told by your health care provider.  If you were  prescribed an antibiotic, take it as told by your health care provider. Do not stop taking the antibiotic even if you start to feel better.  Rest until your condition is under control.  Drink enough fluid to keep your urine clear or pale yellow.  Do not smoke. If you need help quitting, ask your health care provider.  Keep all follow-up visits as told by your health care provider. This is important. How is this  prevented?  Get the vaccinations that your health care provider recommends.  Clean and cover any scrapes or cuts.  Take good care of your skin. This includes regular bathing and moisturizing.  Wash your hands often.  Practice good oral hygiene. Brush your teeth two times a day and floss regularly. Get help right away if:  You have pain.  You have a fever.  You have trouble breathing.  Your skin becomes blotchy, pale, or clammy.  You develop confusion, dizziness, or weakness.  You develop diarrhea.  You develop any new symptoms after treatment. Summary  Bacteremia is the presence of bacteria in the blood. When bacteria enter the bloodstream, they can cause a life- threatening reaction called sepsis.  Children and elderly adults are at increased risk of bacteremia. Other risk factors include having a long-lasting (chronic) disease or a weak immune system, having an artificial joint or heart valve, having heart valve disease, having tubes that were inserted in the body for medical treatment, or using IV drugs.  Some symptoms of bacteremia include fever, chills, shortness of breath, confusion, nausea or vomiting, and diarrhea.  Tests may be done to diagnose a source of infection that led to bacteremia. These tests may include blood tests, urine tests, and imaging tests.  Bacteremia is usually treated with antibiotics, usually in a hospital. This information is not intended to replace advice given to you by your health care provider. Make sure you discuss any questions you have with your health care provider. Document Released: 03/09/2006 Document Revised: 04/22/2016 Document Reviewed: 04/22/2016 Elsevier Interactive Patient Education  2017 [employer]   Urinary Tract Infection, Adult A urinary tract infection (UTI) is an infection of any part of the urinary tract, which includes the kidneys, ureters, bladder, and urethra. These organs make, store, and get rid of urine in the  body. UTI can be a bladder infection (cystitis) or kidney infection (pyelonephritis). What are the causes? This infection may be caused by fungi, viruses, or bacteria. Bacteria are the most common cause of UTIs. This condition can also be caused by repeated incomplete emptying of the bladder during urination. What increases the risk? This condition is more likely to develop if:  You ignore your need to urinate or hold urine for long periods of time.  You do not empty your bladder completely during urination.  You wipe back to front after urinating or having a bowel movement, if you are female.  You are uncircumcised, if you are female.  You are constipated.  You have a urinary catheter that stays in place (indwelling).  You have a weak defense (immune) system.  You have a medical condition that affects your bowels, kidneys, or bladder.  You have diabetes.  You take antibiotic medicines frequently or for long periods of time, and the antibiotics no longer work well against certain types of infections (antibiotic resistance).  You take medicines that irritate your urinary tract.  You are exposed to chemicals that irritate your urinary tract.  You are female. What are the signs or symptoms?  Symptoms of this condition include:  Fever.  Frequent urination or passing small amounts of urine frequently.  Needing to urinate urgently.  Pain or burning with urination.  Urine that smells bad or unusual.  Cloudy urine.  Pain in the lower abdomen or back.  Trouble urinating.  Blood in the urine.  Vomiting or being less hungry than normal.  Diarrhea or abdominal pain.  Vaginal discharge, if you are female. How is this diagnosed? This condition is diagnosed with a medical history and physical exam. You will also need to provide a urine sample to test your urine. Other tests may be done, including:  Blood tests.  Sexually transmitted disease (STD) testing. If you have had  more than one UTI, a cystoscopy or imaging studies may be done to determine the cause of the infections. How is this treated? Treatment for this condition often includes a combination of two or more of the following:  Antibiotic medicine.  Other medicines to treat less common causes of UTI.  Over-the-counter medicines to treat pain.  Drinking enough water to stay hydrated. Follow these instructions at home:  Take over-the-counter and prescription medicines only as told by your health care provider.  If you were prescribed an antibiotic, take it as told by your health care provider. Do not stop taking the antibiotic even if you start to feel better.  Avoid alcohol, caffeine, tea, and carbonated beverages. They can irritate your bladder.  Drink enough fluid to keep your urine clear or pale yellow.  Keep all follow-up visits as told by your health care provider. This is important.  Make sure to:  Empty your bladder often and completely. Do not hold urine for long periods of time.  Empty your bladder before and after sex.  Wipe from front to back after a bowel movement if you are female. Use each tissue one time when you wipe. Contact a health care provider if:  You have back pain.  You have a fever.  You feel nauseous or vomit.  Your symptoms do not get better after 3 days.  Your symptoms go away and then return. Get help right away if:  You have severe back pain or lower abdominal pain.  You are vomiting and cannot keep down any medicines or water. This information is not intended to replace advice given to you by your health care provider. Make sure you discuss any questions you have with your health care provider. Document Released: 03/05/2005 Document Revised: 11/07/2015 Document Reviewed: 04/16/2015 Elsevier Interactive Patient Education  2017 [employer].

## 2016-09-21 NOTE — Progress Notes (Signed)
Patient has been unable to void and was slightly wet earlier in the shift, bladder scan showed 375cc and MD called for straight cath order and also for Potassium replacement order for K+ 3.1, will give this am, no other changes noted.

## 2016-09-21 NOTE — Care Management Note (Signed)
Case Management Note  Patient Details  Name: Allison Mendez MRN: [id number] Date of Birth: August 26, 1940  Subjective/Objective:    Pt admitted with AMS                Action/Plan:   PTA from home independent.  Pt is visiting sister in [place] but plans to fly back to [place] 09/11/16.  Pt declined for CM to arrange [facility] while pt resides in [place], states she already has wheelchair and that her sister can help her.  Pt states she has PCP in [place] and denied barriers to obtaining prescription medications.  CM informed pt that when she returns to [place] she can utilize her PCP for [facility] orders if needed.   Expected Discharge Date:  09/21/16               Expected Discharge Plan:  Home/Self Care  In-House Referral:     Discharge planning Services  CM Consult  Post Acute Care Choice:    Choice offered to:     DME Arranged:    DME Agency:     HH Arranged:    HH Agency:     Status of Service:  Completed, signed off  If discussed at [employer] of Stay Meetings, dates discussed:    Additional Comments:  Cherylann Parr, RN 09/21/2016, 9:57 AM

## 2016-09-21 NOTE — Discharge Summary (Signed)
DISCHARGE SUMMARY  Gregory Dowe  MR#: [id number]  DOB:[birth year]  Date of Admission: 09/17/2016 Date of Discharge: 09/21/2016  Attending Physician:Finleigh Cheong T  Patient's PCP:Pcp Not In System - pt lives in [place] and was just in [place] visiting family  Consults:  none  Disposition: D/C home  Follow-up Appts: Follow-up Information    Your Primary Care Provider in [place] Follow up.   Why:  Please schedule a follow-up visit with your PCP upon returning home to have you blood pressure checked.            Discharge Diagnoses: UTI  E coli bacteremia  Transaminitis Elevated troponin Acute kidney injury Hypertension w/ newly appreciated Grade 2 Diastolic CHF w/o exacerbation  Peripheral vascular disease status post right above-the-knee amputation + Left heel wound (stage 1) Rheumatoid arthritis/rheumatoid lung  Initial presentation: 76 year old female with history of hypertension, peripheral vascular disease, rheumatoid arthritis, and rheumatoid lung who came to the ER with complaints of fever and nonproductive cough. On a chest x-ray she was found to have possible bibasilar opacities. On her labs she was noted to have elevated troponin, creatinine and LFTs.  Hospital Course:  UTI v/s Bibasilar community acquired pneumonia - E coli bacteremia  CXR suggests possible B basilar opacities - UA abnormal suggestive of UTI, but urine culture w/o growth - blood cultures growing E coli pending sensitivities, but clinically pt has improved quickly on rocephin - I am most convinced this represented an E coli UTI w/ bacteremia - I suspect the changes noted on her CXR are chronic and related to her RA   Transaminitis -Likely secondary to shock liver  -Right upper quadrant ultrasound negative -LFTs have nearly normalized    Elevated troponin -Likely demand ischemia and tachycardia/rate related  -no chest pain  -TTE reveals no WMA  Acute kidney injury -Likely prerenal in  nature - resolved with IV fluids  Hypertension w/ newly appreciated Grade 2 Diastolic CHF w/o exacerbation  Not on medical tx at home - BP improved w/ BB - discussed need for further BP monitoring w/ pt and she agrees to track her BP at home and to see her PCP upon her return to [place]  Peripheral vascular disease status post right above-the-knee amputation + Left heel wound (stage 1) -Stable at this time - care as per WOC team   Rheumatoid arthritis/rheumatoid lung -short course of stress dose steroids was administered given acute infection and persisting tachycardia   Sinus Tachycardia Felt to be due to signif anxiety related to being in the hospital - improved when sleeping, and then would return upon waking - BB initiated    Allergies as of 09/21/2016   No Known Allergies     Medication List    TAKE these medications   alendronate 70 MG tablet Commonly known as:  FOSAMAX Take 70 mg by mouth once a week.   amitriptyline 10 MG tablet Commonly known as:  ELAVIL Take 20 mg by mouth at bedtime.   atorvastatin 20 MG tablet Commonly known as:  LIPITOR Take 20 mg by mouth every evening.   carisoprodol 350 MG tablet Commonly known as:  SOMA Take 350 mg by mouth daily.   cefUROXime 500 MG tablet Commonly known as:  CEFTIN Take 1 tablet (500 mg total) by mouth 2 (two) times daily with a meal.   cyclobenzaprine 10 MG tablet Commonly known as:  FLEXERIL Take 10 mg by mouth daily.   HYDROcodone-acetaminophen 10-325 MG tablet Commonly known as:  NORCO Take 1 tablet  by mouth every 4 (four) hours as needed for moderate pain (Do not exceed 4 tablets per day).   ibuprofen 600 MG tablet Commonly known as:  ADVIL,MOTRIN Take 600 mg by mouth 3 (three) times daily.   LYRICA 75 MG capsule Generic drug:  pregabalin Take 75 mg by mouth 2 (two) times daily.   metoprolol succinate 25 MG 24 hr tablet Commonly known as:  TOPROL-XL Take 0.5 tablets (12.5 mg total) by mouth daily.     predniSONE 1 MG tablet Commonly known as:  DELTASONE Take 5 mg by mouth daily.   temazepam 15 MG capsule Commonly known as:  RESTORIL Take 15 mg by mouth at bedtime as needed for sleep.   timolol 0.5 % ophthalmic gel-forming Commonly known as:  TIMOPTIC-XR Place 1 drop into both eyes every morning.   zolpidem 5 MG tablet Commonly known as:  AMBIEN Take 5 mg by mouth at bedtime.       Day of Discharge BP 103/61 (BP Location: Left Arm)   Pulse 96   Temp 97.7 F (36.5 C) (Oral)   Resp 20   Ht 5\' 7"  (1.702 m)   Wt 41.6 kg (91 lb 12.8 oz)   SpO2 99%   BMI 14.38 kg/m   Physical Exam: General: No acute respiratory distress Lungs: Clear to auscultation bilaterally without wheezes or crackles Cardiovascular: Regular rate and rhythm without murmur gallop or rub normal S1 and S2 Abdomen: Nontender, nondistended, soft, bowel sounds positive, no rebound, no ascites, no appreciable mass Extremities: No significant cyanosis, clubbing, or edema bilateral lower extremities  Basic Metabolic Panel:  Recent Labs Lab 09/17/16 2200 09/18/16 0729 09/19/16 0320 09/21/16 0447  NA 135 141 139 142  K 3.4* 3.7 3.7 3.1*  CL 105 110 109 104  CO2 25 18* 24 32  GLUCOSE 91 85 90 110*  BUN 49* 35* 20 10  CREATININE 2.29* 1.52* 0.85 0.59  CALCIUM 7.6* 7.8* 8.2* 8.1*  MG  --  1.8  --   --   PHOS  --  3.6  --   --     Liver Function Tests:  Recent Labs Lab 09/17/16 2200 09/18/16 0729 09/19/16 0320 09/21/16 0447  AST 355* 250* 99* 30  ALT 837* 707* 465* 210*  ALKPHOS 115 109 98 100  BILITOT 2.7* 2.3* 1.5* 0.9  PROT 5.4* 5.3* 4.8* 5.3*  ALBUMIN 2.7* 2.7* 2.3* 2.4*    Recent Labs Lab 09/18/16 0729  LIPASE 22    Recent Labs Lab 09/18/16 0005  AMMONIA 13    Coags:  Recent Labs Lab 09/18/16 0729 09/19/16 0320  INR 1.05 1.02    CBC:  Recent Labs Lab 09/17/16 2221 09/18/16 0729 09/19/16 0320 09/21/16 0447  WBC 11.0* 10.6* 7.2 7.9  NEUTROABS 9.6*  --   --    --   HGB 13.1 13.0 11.2* 12.3  HCT 39.7 39.1 34.5* 37.6  MCV 97.3 96.8 95.8 95.9  PLT 134* 131* 118* 160    Cardiac Enzymes:  Recent Labs Lab 09/17/16 2200 09/18/16 1238 09/18/16 1849 09/21/16 0447  TROPONINI 0.39* 0.03* 0.16* 0.03*    Recent Results (from the past 240 hour(s))  Blood culture (routine x 2)     Status: None (Preliminary result)   Collection Time: 09/17/16  9:49 PM  Result Value Ref Range Status   Specimen Description BLOOD LEFT HAND  Final   Special Requests   Final    BOTTLES DRAWN AEROBIC AND ANAEROBIC Blood Culture results may not  be optimal due to an inadequate volume of blood received in culture bottles   Culture   Final    NO GROWTH 2 DAYS Performed at [facility], [address] [place], [place] [address]    Report Status PENDING  Incomplete  Urine culture     Status: None   Collection Time: 09/17/16 10:16 PM  Result Value Ref Range Status   Specimen Description URINE, CATHETERIZED  Final   Special Requests NONE  Final   Culture   Final    NO GROWTH Performed at [facility], [address] [place], [place] [address]    Report Status 09/19/2016 FINAL  Final  Blood culture (routine x 2)     Status: Abnormal   Collection Time: 09/17/16 10:55 PM  Result Value Ref Range Status   Specimen Description BLOOD LEFT WRIST  Final   Special Requests BOTTLES DRAWN AEROBIC AND ANAEROBIC BCAV  Final   Culture  Setup Time   Final    GRAM NEGATIVE RODS AEROBIC BOTTLE ONLY CRITICAL RESULT CALLED TO, READ BACK BY AND VERIFIED WITH: A JOHNSTON PHARMD 2050 09/18/16 A BROWNING Performed at [facility], [address], [place], [place] [address]    Culture ESCHERICHIA COLI (A)  Final   Report Status 09/21/2016 FINAL  Final   Organism ID, Bacteria ESCHERICHIA COLI  Final      Susceptibility   Escherichia coli - MIC*    AMPICILLIN 4 SENSITIVE Sensitive     CEFAZOLIN <=4 SENSITIVE Sensitive     CEFEPIME <=1 SENSITIVE Sensitive      CEFTAZIDIME <=1 SENSITIVE Sensitive     CEFTRIAXONE <=1 SENSITIVE Sensitive     CIPROFLOXACIN <=0.25 SENSITIVE Sensitive     GENTAMICIN <=1 SENSITIVE Sensitive     IMIPENEM <=0.25 SENSITIVE Sensitive     TRIMETH/SULFA <=20 SENSITIVE Sensitive     AMPICILLIN/SULBACTAM <=2 SENSITIVE Sensitive     PIP/TAZO <=4 SENSITIVE Sensitive     Extended ESBL NEGATIVE Sensitive     * ESCHERICHIA COLI  Blood Culture ID Panel (Reflexed)     Status: Abnormal   Collection Time: 09/17/16 10:55 PM  Result Value Ref Range Status   Enterococcus species NOT DETECTED NOT DETECTED Final   Listeria monocytogenes NOT DETECTED NOT DETECTED Final   Staphylococcus species NOT DETECTED NOT DETECTED Final   Staphylococcus aureus NOT DETECTED NOT DETECTED Final   Streptococcus species NOT DETECTED NOT DETECTED Final   Streptococcus agalactiae NOT DETECTED NOT DETECTED Final   Streptococcus pneumoniae NOT DETECTED NOT DETECTED Final   Streptococcus pyogenes NOT DETECTED NOT DETECTED Final   Acinetobacter baumannii NOT DETECTED NOT DETECTED Final   Enterobacteriaceae species DETECTED (A) NOT DETECTED Final    Comment: Enterobacteriaceae represent a large family of gram-negative bacteria, not a single organism. CRITICAL RESULT CALLED TO, READ BACK BY AND VERIFIED WITH: A JOHNSTON PHARMD 2050 09/18/16 A BROWNING    Enterobacter cloacae complex NOT DETECTED NOT DETECTED Final   Escherichia coli DETECTED (A) NOT DETECTED Final    Comment: CRITICAL RESULT CALLED TO, READ BACK BY AND VERIFIED WITH: A JOHNSTON PHARMD 2050 09/18/16 A BROWNING    Klebsiella oxytoca NOT DETECTED NOT DETECTED Final   Klebsiella pneumoniae NOT DETECTED NOT DETECTED Final   Proteus species NOT DETECTED NOT DETECTED Final   Serratia marcescens NOT DETECTED NOT DETECTED Final   Carbapenem resistance NOT DETECTED NOT DETECTED Final   Haemophilus influenzae NOT DETECTED NOT DETECTED Final   Neisseria meningitidis  NOT DETECTED NOT DETECTED Final    Pseudomonas aeruginosa NOT DETECTED NOT DETECTED Final   Candida albicans NOT DETECTED NOT DETECTED Final   Candida glabrata NOT DETECTED NOT DETECTED Final   Candida krusei NOT DETECTED NOT DETECTED Final   Candida parapsilosis NOT DETECTED NOT DETECTED Final   Candida tropicalis NOT DETECTED NOT DETECTED Final    Comment: Performed at [facility], [address], [place], [place] [address]  MRSA PCR Screening     Status: None   Collection Time: 09/18/16  3:08 AM  Result Value Ref Range Status   MRSA by PCR NEGATIVE NEGATIVE Final    Comment:        The GeneXpert MRSA Assay (FDA approved for NASAL specimens only), is one component of a comprehensive MRSA colonization surveillance program. It is not intended to diagnose MRSA infection nor to guide or monitor treatment for MRSA infections.      Time spent in discharge (includes decision making & examination of pt): 35 minutes  09/21/2016, 9:48 AM   Lonia Blood, MD [facility] Office  [phone] Pager [phone]  On-Call/Text Page:      [email or web address]      password [facility]

## 2016-09-21 NOTE — Progress Notes (Signed)
Patient assisted with 2 people to [facility] and voided 100cc yellow, amber, cloudy urine, will bladder scan for PVR and got 313-325cc, reported to [facility] and let him know that he has an order to straight cath her. Patient is aware but states that she doesn't feel uncomfortable and would rather try to void again on the [facility] in a little while, will transfer care to him, Potassium replaced as per order.

## 2016-09-21 NOTE — Progress Notes (Signed)
Pt maintained Sat above 90 on room air.  Colleen Can, RN

## 2016-09-21 NOTE — Plan of Care (Signed)
Problem: Safety: Goal: Ability to remain free from injury will improve Outcome: Progressing Pt uses call light as instructed and is within reach as is her white phone which she was told about her RN/NT numbers to reach via her white phone and she verbalized understanding, her personal items are within reach on her bedside stand and the bed alarm was turned on and explained to her why we use it and she was okay with that, will continue to monitor.

## 2016-09-21 NOTE — Evaluation (Signed)
Occupational Therapy Evaluation Patient Details Name: Beryl Hornberger MRN: [id number] DOB: May 18, 1941 Today's Date: 09/21/2016    History of Present Illness Patient is a 76 yo female admitted 09/17/16 with AMS.  Patient had fall 2 days pta.  Patient also with fever, cough.  Patient with sepsis, CAP vs UTI; HTN; AKI.     PMH:  HTN, PVD, Rt AKA, RA/Rheumatoid lung, wound Lt heel/ankle, chronic pain   Clinical Impression   PTA, pt was living alone and Mod independent with use of compensatory strategies and w/c due to R AKA and RA in B hands. Pt demonstrated near baseline function with set up for grooming at EOB and Min guard A to stand pivot to recliner. Pt reports that she will be returning to her sister's till she flies back to [place] in May. Pt would benefit for further skilled OT to facilitate safe dc home and return to PLOF. Recommend pt dc home once medically stable.     Follow Up Recommendations  No OT follow up;Supervision/Assistance - 24 hour    Equipment Recommendations  None recommended by OT    Recommendations for Other Services PT consult     Precautions / Restrictions Precautions Precautions: Fall Restrictions Weight Bearing Restrictions: No      Mobility Bed Mobility Overal bed mobility: Modified Independent             General bed mobility comments: Pt performed bed mobility with supervision and HOB elevated. Pt demosntrated good strength and balance to scott closer to EOB.   Transfers Overall transfer level: Needs assistance Equipment used: None Transfers: Stand Pivot Transfers;Sit to/from Stand Sit to Stand: Min guard Stand pivot transfers: Min guard       General transfer comment: Pt demonstrated good technique and hand placement    Balance Overall balance assessment: Needs assistance Sitting-balance support: No upper extremity supported;Feet supported (Single foot support on floor) Sitting balance-Leahy Scale: Good Sitting balance - Comments: Demonstrated  good weight shift to lift LLE off floor and perform groomign at EOB   Standing balance support: During functional activity;Bilateral upper extremity supported Standing balance-Leahy Scale: Fair Standing balance comment: Performed transfer with good balance                           ADL either performed or assessed with clinical judgement   ADL Overall ADL's : Modified independent (Near baseline function)                                       General ADL Comments: Pt performed groomign at EOB and demonstrates good fucntional use of hands and sitting balance. Pt perform stand pivot transfer from EOB to recliner with Min guard A for safety but demosntrated good technique and control     Vision         Perception     Praxis      Pertinent Vitals/Pain Pain Assessment: No/denies pain     Hand Dominance Right   Extremity/Trunk Assessment Upper Extremity Assessment Upper Extremity Assessment: Overall WFL for tasks assessed;RUE deficits/detail;LUE deficits/detail (Pt demonstrates good fucntional use of her UE and hands) RUE Deficits / Details: Significant RA changes to hands.  Decreased shoulder ROM. RUE Coordination: decreased fine motor LUE Deficits / Details: Significant RA changes to hands.  Decreased shoulder ROM. LUE Coordination: decreased fine motor   Lower Extremity Assessment Lower Extremity  Assessment: Defer to PT evaluation;RLE deficits/detail RLE Deficits / Details: Patient s/p Rt AKA   Cervical / Trunk Assessment Cervical / Trunk Assessment: Kyphotic   Communication Communication Communication: No difficulties   Cognition Arousal/Alertness: Awake/alert Behavior During Therapy: WFL for tasks assessed/performed Overall Cognitive Status: Within Functional Limits for tasks assessed                                 General Comments: Pt awake and alert throughout session. Pt's sister also present to confirm earlier confusion  and current cognitition. Pt answered all question and is oriented   General Comments  Sister present for session. VSS throughout session    Exercises     Shoulder Instructions      Home Living Family/patient expects to be discharged to:: Private residence Living Arrangements: Other relatives (Sister) Available Help at Discharge: Family;Available 24 hours/day (Sister) Type of Home: House Home Access: Level entry     Home Layout: Two level;Bed/bath upstairs     Bathroom Shower/Tub: Producer, television/film/video: Standard     Home Equipment: Wheelchair - manual;Shower seat   Additional Comments: Patient lives in [place]. Visiting sister and will be staying till early may      Prior Functioning/Environment Level of Independence: Independent with assistive device(s);Needs assistance  Gait / Transfers Assistance Needed: Able to transfer into/out of w/c on her own.  Bumps up/down stairs on bottom.  Requires assist to move w/c up/down stairs. ADL's / Homemaking Assistance Needed: Patient reports she is independent in ADLs. Also reports that she has an aide 2x/week to assist with errands/transportation in [place].            OT Problem List: Decreased strength;Decreased activity tolerance;Impaired balance (sitting and/or standing);Impaired UE functional use      OT Treatment/Interventions: Self-care/ADL training;Therapeutic exercise;Energy conservation;DME and/or AE instruction;Therapeutic activities;Patient/family education    OT Goals(Current goals can be found in the care plan section) Acute Rehab OT Goals Patient Stated Goal: To return home. OT Goal Formulation: With patient Time For Goal Achievement: 10/05/16 Potential to Achieve Goals: Good ADL Goals Pt Will Perform Lower Body Bathing: with set-up;with supervision;sit to/from stand Pt Will Perform Lower Body Dressing: with set-up;with supervision;sit to/from stand Pt Will Transfer to Toilet: with set-up;with  supervision;stand pivot transfer;bedside commode Pt Will Perform Tub/Shower Transfer: Tub transfer;tub bench;Stand pivot transfer;with set-up;with supervision  OT Frequency: Min 2X/week   Barriers to D/C:            Co-evaluation              End of Session Equipment Utilized During Treatment: Gait belt Nurse Communication: Mobility status  Activity Tolerance: Patient tolerated treatment well Patient left: in chair;with call bell/phone within reach;with family/visitor present  OT Visit Diagnosis: Unsteadiness on feet (R26.81);Muscle weakness (generalized) (M62.81)                Time: 6579-0383 OT Time Calculation (min): 31 min Charges:  OT General Charges $OT Visit: 1 Procedure OT Evaluation $OT Eval Low Complexity: 1 Procedure OT Treatments $Self Care/Home Management : 8-22 mins G-Codes:     [employer], OTR/L [phone]  Theodoro Grist Siddiq Kaluzny 09/21/2016, 12:40 PM

## 2016-09-21 NOTE — Progress Notes (Signed)
Report received in patient's room via Shanda Bumps RN using SBAR format, reviewed VS, meds, tests and pt's general condition, assumed care of patient.

## 2016-09-22 LAB — LEGIONELLA PNEUMOPHILA SEROGP 1 UR AG: L. PNEUMOPHILA SEROGP 1 UR AG: NEGATIVE

## 2016-09-23 LAB — CULTURE, BLOOD (ROUTINE X 2): Culture: NO GROWTH

## 2016-09-24 ENCOUNTER — Emergency Department (HOSPITAL_BASED_OUTPATIENT_CLINIC_OR_DEPARTMENT_OTHER): Payer: Medicare (Managed Care)

## 2016-09-24 ENCOUNTER — Inpatient Hospital Stay (HOSPITAL_BASED_OUTPATIENT_CLINIC_OR_DEPARTMENT_OTHER)
Admission: EM | Admit: 2016-09-24 | Discharge: 2016-09-30 | DRG: 871 | Disposition: A | Discharge status: Hospice | Payer: Medicare (Managed Care) | Attending: Internal Medicine | Admitting: Internal Medicine

## 2016-09-24 ENCOUNTER — Encounter (HOSPITAL_BASED_OUTPATIENT_CLINIC_OR_DEPARTMENT_OTHER): Payer: Self-pay | Admitting: Emergency Medicine

## 2016-09-24 ENCOUNTER — Other Ambulatory Visit: Payer: Self-pay

## 2016-09-24 ENCOUNTER — Inpatient Hospital Stay (HOSPITAL_COMMUNITY): Payer: Medicare (Managed Care)

## 2016-09-24 DIAGNOSIS — I739 Peripheral vascular disease, unspecified: Secondary | ICD-10-CM | POA: Diagnosis present

## 2016-09-24 DIAGNOSIS — F329 Major depressive disorder, single episode, unspecified: Secondary | ICD-10-CM | POA: Diagnosis present

## 2016-09-24 DIAGNOSIS — Z89611 Acquired absence of right leg above knee: Secondary | ICD-10-CM

## 2016-09-24 DIAGNOSIS — W19XXXA Unspecified fall, initial encounter: Secondary | ICD-10-CM | POA: Diagnosis present

## 2016-09-24 DIAGNOSIS — S91012A Laceration without foreign body, left ankle, initial encounter: Secondary | ICD-10-CM | POA: Diagnosis present

## 2016-09-24 DIAGNOSIS — W108XXA Fall (on) (from) other stairs and steps, initial encounter: Secondary | ICD-10-CM | POA: Diagnosis present

## 2016-09-24 DIAGNOSIS — S0083XA Contusion of other part of head, initial encounter: Secondary | ICD-10-CM | POA: Diagnosis present

## 2016-09-24 DIAGNOSIS — Y92008 Other place in unspecified non-institutional (private) residence as the place of occurrence of the external cause: Secondary | ICD-10-CM | POA: Diagnosis not present

## 2016-09-24 DIAGNOSIS — Z7983 Long term (current) use of bisphosphonates: Secondary | ICD-10-CM

## 2016-09-24 DIAGNOSIS — N390 Urinary tract infection, site not specified: Secondary | ICD-10-CM | POA: Diagnosis present

## 2016-09-24 DIAGNOSIS — I10 Essential (primary) hypertension: Secondary | ICD-10-CM | POA: Diagnosis present

## 2016-09-24 DIAGNOSIS — Z66 Do not resuscitate: Secondary | ICD-10-CM | POA: Diagnosis present

## 2016-09-24 DIAGNOSIS — S81012A Laceration without foreign body, left knee, initial encounter: Secondary | ICD-10-CM | POA: Diagnosis present

## 2016-09-24 DIAGNOSIS — Z681 Body mass index (BMI) 19 or less, adult: Secondary | ICD-10-CM

## 2016-09-24 DIAGNOSIS — S41111A Laceration without foreign body of right upper arm, initial encounter: Secondary | ICD-10-CM | POA: Diagnosis present

## 2016-09-24 DIAGNOSIS — D62 Acute posthemorrhagic anemia: Secondary | ICD-10-CM | POA: Diagnosis present

## 2016-09-24 DIAGNOSIS — A419 Sepsis, unspecified organism: Secondary | ICD-10-CM | POA: Diagnosis present

## 2016-09-24 DIAGNOSIS — Z79899 Other long term (current) drug therapy: Secondary | ICD-10-CM

## 2016-09-24 DIAGNOSIS — I110 Hypertensive heart disease with heart failure: Secondary | ICD-10-CM | POA: Diagnosis present

## 2016-09-24 DIAGNOSIS — Z7952 Long term (current) use of systemic steroids: Secondary | ICD-10-CM | POA: Diagnosis not present

## 2016-09-24 DIAGNOSIS — M0510 Rheumatoid lung disease with rheumatoid arthritis of unspecified site: Secondary | ICD-10-CM | POA: Diagnosis present

## 2016-09-24 DIAGNOSIS — R64 Cachexia: Secondary | ICD-10-CM | POA: Diagnosis present

## 2016-09-24 DIAGNOSIS — E876 Hypokalemia: Secondary | ICD-10-CM | POA: Diagnosis present

## 2016-09-24 DIAGNOSIS — E440 Moderate protein-calorie malnutrition: Secondary | ICD-10-CM | POA: Diagnosis present

## 2016-09-24 DIAGNOSIS — Z23 Encounter for immunization: Secondary | ICD-10-CM

## 2016-09-24 DIAGNOSIS — Z515 Encounter for palliative care: Secondary | ICD-10-CM | POA: Diagnosis present

## 2016-09-24 DIAGNOSIS — J9601 Acute respiratory failure with hypoxia: Secondary | ICD-10-CM | POA: Diagnosis present

## 2016-09-24 DIAGNOSIS — I5032 Chronic diastolic (congestive) heart failure: Secondary | ICD-10-CM | POA: Diagnosis present

## 2016-09-24 DIAGNOSIS — M069 Rheumatoid arthritis, unspecified: Secondary | ICD-10-CM | POA: Diagnosis not present

## 2016-09-24 DIAGNOSIS — W19XXXD Unspecified fall, subsequent encounter: Secondary | ICD-10-CM | POA: Diagnosis not present

## 2016-09-24 DIAGNOSIS — R0600 Dyspnea, unspecified: Secondary | ICD-10-CM | POA: Diagnosis not present

## 2016-09-24 DIAGNOSIS — D649 Anemia, unspecified: Secondary | ICD-10-CM | POA: Diagnosis not present

## 2016-09-24 DIAGNOSIS — R6510 Systemic inflammatory response syndrome (SIRS) of non-infectious origin without acute organ dysfunction: Secondary | ICD-10-CM | POA: Diagnosis not present

## 2016-09-24 DIAGNOSIS — E785 Hyperlipidemia, unspecified: Secondary | ICD-10-CM | POA: Diagnosis present

## 2016-09-24 LAB — URINALYSIS, MICROSCOPIC (REFLEX)

## 2016-09-24 LAB — CBC WITH DIFFERENTIAL/PLATELET
Basophils Absolute: 0 10*3/uL (ref 0.0–0.1)
Basophils Relative: 0 %
Eosinophils Absolute: 0 10*3/uL (ref 0.0–0.7)
Eosinophils Relative: 0 %
HCT: 36.7 % (ref 36.0–46.0)
Hemoglobin: 12 g/dL (ref 12.0–15.0)
Lymphocytes Relative: 4 %
Lymphs Abs: 1.2 10*3/uL (ref 0.7–4.0)
MCH: 31.9 pg (ref 26.0–34.0)
MCHC: 32.7 g/dL (ref 30.0–36.0)
MCV: 97.6 fL (ref 78.0–100.0)
Monocytes Absolute: 0.9 10*3/uL (ref 0.1–1.0)
Monocytes Relative: 3 %
Neutro Abs: 27 10*3/uL — ABNORMAL HIGH (ref 1.7–7.7)
Neutrophils Relative %: 93 %
Platelets: 266 10*3/uL (ref 150–400)
RBC: 3.76 MIL/uL — ABNORMAL LOW (ref 3.87–5.11)
RDW: 13.1 % (ref 11.5–15.5)
WBC: 29.1 10*3/uL — ABNORMAL HIGH (ref 4.0–10.5)

## 2016-09-24 LAB — HEPATIC FUNCTION PANEL
ALBUMIN: 2.6 g/dL — AB (ref 3.5–5.0)
ALK PHOS: 148 U/L — AB (ref 38–126)
ALT: 121 U/L — ABNORMAL HIGH (ref 14–54)
AST: 123 U/L — ABNORMAL HIGH (ref 15–41)
BILIRUBIN TOTAL: 2.9 mg/dL — AB (ref 0.3–1.2)
Bilirubin, Direct: 1.8 mg/dL — ABNORMAL HIGH (ref 0.1–0.5)
Indirect Bilirubin: 1.1 mg/dL — ABNORMAL HIGH (ref 0.3–0.9)
Total Protein: 5 g/dL — ABNORMAL LOW (ref 6.5–8.1)

## 2016-09-24 LAB — CBC
HCT: 29.4 % — ABNORMAL LOW (ref 36.0–46.0)
HEMATOCRIT: 28.3 % — AB (ref 36.0–46.0)
Hemoglobin: 9.5 g/dL — ABNORMAL LOW (ref 12.0–15.0)
Hemoglobin: 9.3 g/dL — ABNORMAL LOW (ref 12.0–15.0)
MCH: 31.7 pg (ref 26.0–34.0)
MCH: 31.6 pg (ref 26.0–34.0)
MCHC: 32.3 g/dL (ref 30.0–36.0)
MCHC: 32.9 g/dL (ref 30.0–36.0)
MCV: 98 fL (ref 78.0–100.0)
MCV: 96.3 fL (ref 78.0–100.0)
Platelets: 188 10*3/uL (ref 150–400)
Platelets: 197 10*3/uL (ref 150–400)
RBC: 3 MIL/uL — ABNORMAL LOW (ref 3.87–5.11)
RBC: 2.94 MIL/uL — ABNORMAL LOW (ref 3.87–5.11)
RDW: 12.9 % (ref 11.5–15.5)
RDW: 13.5 % (ref 11.5–15.5)
WBC: 20.3 10*3/uL — ABNORMAL HIGH (ref 4.0–10.5)
WBC: 14.3 10*3/uL — ABNORMAL HIGH (ref 4.0–10.5)

## 2016-09-24 LAB — I-STAT CG4 LACTIC ACID, ED: Lactic Acid, Venous: 1.28 mmol/L (ref 0.5–1.9)

## 2016-09-24 LAB — URINALYSIS, ROUTINE W REFLEX MICROSCOPIC
Bilirubin Urine: NEGATIVE
Glucose, UA: NEGATIVE mg/dL
Hgb urine dipstick: NEGATIVE
Ketones, ur: 15 mg/dL — AB
Leukocytes, UA: NEGATIVE
Nitrite: NEGATIVE
Protein, ur: 30 mg/dL — AB
Specific Gravity, Urine: 1.024 (ref 1.005–1.030)
pH: 6 (ref 5.0–8.0)

## 2016-09-24 LAB — MRSA PCR SCREENING: MRSA by PCR: NEGATIVE

## 2016-09-24 LAB — BASIC METABOLIC PANEL
Anion gap: 7 (ref 5–15)
BUN: 11 mg/dL (ref 6–20)
CO2: 34 mmol/L — ABNORMAL HIGH (ref 22–32)
Calcium: 8.5 mg/dL — ABNORMAL LOW (ref 8.9–10.3)
Chloride: 100 mmol/L — ABNORMAL LOW (ref 101–111)
Creatinine, Ser: 0.54 mg/dL (ref 0.44–1.00)
GFR calc Af Amer: >60 mL/min (ref >60)
GFR calc non Af Amer: >60 mL/min (ref >60)
Glucose, Bld: 109 mg/dL — ABNORMAL HIGH (ref 65–99)
Potassium: 3.2 mmol/L — ABNORMAL LOW (ref 3.5–5.1)
Sodium: 141 mmol/L (ref 135–145)

## 2016-09-24 LAB — LACTIC ACID, PLASMA: LACTIC ACID, VENOUS: 2.4 mmol/L — AB (ref 0.5–1.9)

## 2016-09-24 LAB — BRAIN NATRIURETIC PEPTIDE: B NATRIURETIC PEPTIDE 5: 96 pg/mL (ref 0.0–100.0)

## 2016-09-24 MED ORDER — SODIUM CHLORIDE 0.9 % IV BOLUS (SEPSIS)
1000.0000 mL | Freq: Once | INTRAVENOUS | Status: AC
  Administered 2016-09-24: 1000 mL via INTRAVENOUS

## 2016-09-24 MED ORDER — CYCLOBENZAPRINE HCL 10 MG PO TABS
10.0000 mg | ORAL_TABLET | Freq: Every day | ORAL | Status: DC
  Administered 2016-09-25 – 2016-09-30 (×6): 10 mg via ORAL
  Filled 2016-09-24 (×6): qty 1

## 2016-09-24 MED ORDER — SODIUM CHLORIDE 0.9 % IV SOLN
INTRAVENOUS | Status: DC
  Administered 2016-09-24 – 2016-09-27 (×3): via INTRAVENOUS

## 2016-09-24 MED ORDER — ALBUTEROL SULFATE (2.5 MG/3ML) 0.083% IN NEBU
2.5000 mg | INHALATION_SOLUTION | RESPIRATORY_TRACT | Status: DC | PRN
  Administered 2016-09-27 – 2016-09-28 (×3): 2.5 mg via RESPIRATORY_TRACT
  Filled 2016-09-24 (×3): qty 3

## 2016-09-24 MED ORDER — POTASSIUM CHLORIDE 20 MEQ/15ML (10%) PO SOLN
40.0000 meq | Freq: Once | ORAL | Status: AC
  Administered 2016-09-24: 40 meq via ORAL
  Filled 2016-09-24: qty 30

## 2016-09-24 MED ORDER — PIPERACILLIN-TAZOBACTAM 3.375 G IVPB 30 MIN
3.3750 g | Freq: Once | INTRAVENOUS | Status: AC
  Administered 2016-09-24: 3.375 g via INTRAVENOUS
  Filled 2016-09-24 (×2): qty 50

## 2016-09-24 MED ORDER — TIMOLOL MALEATE 0.5 % OP SOLG
1.0000 [drp] | Freq: Every day | OPHTHALMIC | Status: DC
  Administered 2016-09-25 – 2016-09-30 (×6): 1 [drp] via OPHTHALMIC
  Filled 2016-09-24: qty 5

## 2016-09-24 MED ORDER — SODIUM CHLORIDE 0.9 % IV BOLUS (SEPSIS)
250.0000 mL | Freq: Once | INTRAVENOUS | Status: AC
  Administered 2016-09-25: 250 mL via INTRAVENOUS

## 2016-09-24 MED ORDER — ACETAMINOPHEN 325 MG PO TABS
650.0000 mg | ORAL_TABLET | Freq: Four times a day (QID) | ORAL | Status: DC | PRN
  Filled 2016-09-24: qty 2

## 2016-09-24 MED ORDER — ONDANSETRON HCL 4 MG PO TABS: 4.0000 mg | ORAL_TABLET | Freq: Four times a day (QID) | ORAL | Status: DC | PRN

## 2016-09-24 MED ORDER — ADULT MULTIVITAMIN W/MINERALS CH
1.0000 | ORAL_TABLET | Freq: Every day | ORAL | Status: DC
  Administered 2016-09-25 – 2016-09-30 (×6): 1 via ORAL
  Filled 2016-09-24 (×6): qty 1

## 2016-09-24 MED ORDER — SODIUM CHLORIDE 0.9% FLUSH
3.0000 mL | Freq: Two times a day (BID) | INTRAVENOUS | Status: DC
  Administered 2016-09-25 – 2016-09-30 (×6): 3 mL via INTRAVENOUS

## 2016-09-24 MED ORDER — HYDROCODONE-ACETAMINOPHEN 10-325 MG PO TABS
1.0000 | ORAL_TABLET | Freq: Once | ORAL | Status: DC
  Filled 2016-09-24: qty 1

## 2016-09-24 MED ORDER — VITAMIN D3 25 MCG (1000 UNIT) PO TABS
1000.0000 [IU] | ORAL_TABLET | Freq: Every day | ORAL | Status: DC
  Administered 2016-09-25 – 2016-09-28 (×4): 1000 [IU] via ORAL
  Filled 2016-09-24 (×4): qty 1

## 2016-09-24 MED ORDER — METOPROLOL SUCCINATE ER 25 MG PO TB24
12.5000 mg | ORAL_TABLET | Freq: Every day | ORAL | Status: DC
  Administered 2016-09-24 – 2016-09-26 (×3): 12.5 mg via ORAL
  Filled 2016-09-24 (×3): qty 1

## 2016-09-24 MED ORDER — METOPROLOL TARTRATE 50 MG PO TABS
ORAL_TABLET | ORAL | Status: AC
  Administered 2016-09-24: 12.5 mg via ORAL
  Filled 2016-09-24: qty 1

## 2016-09-24 MED ORDER — METOPROLOL TARTRATE 12.5 MG HALF TABLET
12.5000 mg | ORAL_TABLET | Freq: Once | ORAL | Status: DC
  Filled 2016-09-24: qty 1

## 2016-09-24 MED ORDER — ALIGN PO CAPS
1.0000 | ORAL_CAPSULE | Freq: Every day | ORAL | Status: DC
  Administered 2016-09-25 – 2016-09-30 (×6): 1 via ORAL
  Filled 2016-09-24 (×6): qty 1

## 2016-09-24 MED ORDER — ONDANSETRON HCL 4 MG/2ML IJ SOLN
4.0000 mg | Freq: Four times a day (QID) | INTRAMUSCULAR | Status: DC | PRN
  Administered 2016-09-25: 4 mg via INTRAVENOUS
  Filled 2016-09-24: qty 2

## 2016-09-24 MED ORDER — METOPROLOL TARTRATE 12.5 MG HALF TABLET
12.5000 mg | ORAL_TABLET | Freq: Once | ORAL | Status: AC
  Administered 2016-09-24: 12.5 mg via ORAL
  Filled 2016-09-24: qty 1

## 2016-09-24 MED ORDER — HYDROCODONE-ACETAMINOPHEN 5-325 MG PO TABS
2.0000 | ORAL_TABLET | Freq: Once | ORAL | Status: AC
  Administered 2016-09-24: 2 via ORAL

## 2016-09-24 MED ORDER — CARISOPRODOL 350 MG PO TABS: 350.0000 mg | ORAL_TABLET | Freq: Every evening | ORAL | Status: DC | PRN

## 2016-09-24 MED ORDER — VANCOMYCIN HCL IN DEXTROSE 1-5 GM/200ML-% IV SOLN
1000.0000 mg | Freq: Once | INTRAVENOUS | Status: AC
  Administered 2016-09-24: 1000 mg via INTRAVENOUS
  Filled 2016-09-24: qty 200

## 2016-09-24 MED ORDER — PREGABALIN 75 MG PO CAPS
75.0000 mg | ORAL_CAPSULE | Freq: Every day | ORAL | Status: DC | PRN
  Administered 2016-09-27 – 2016-09-29 (×2): 75 mg via ORAL
  Filled 2016-09-24 (×2): qty 1

## 2016-09-24 MED ORDER — ACETAMINOPHEN 650 MG RE SUPP: 650.0000 mg | Freq: Four times a day (QID) | RECTAL | Status: DC | PRN

## 2016-09-24 MED ORDER — ATORVASTATIN CALCIUM 10 MG PO TABS
20.0000 mg | ORAL_TABLET | Freq: Every evening | ORAL | Status: DC
  Administered 2016-09-24 – 2016-09-28 (×5): 20 mg via ORAL
  Filled 2016-09-24 (×5): qty 2

## 2016-09-24 MED ORDER — ZOLPIDEM TARTRATE 5 MG PO TABS
5.0000 mg | ORAL_TABLET | Freq: Every evening | ORAL | Status: DC | PRN
  Administered 2016-09-24 – 2016-09-25 (×2): 5 mg via ORAL
  Filled 2016-09-24 (×2): qty 1

## 2016-09-24 MED ORDER — FLAXSEED OIL 1000 MG PO CAPS
1.0000 | ORAL_CAPSULE | Freq: Every day | ORAL | Status: DC
Start: 2016-09-24 — End: 2016-09-24

## 2016-09-24 MED ORDER — TETANUS-DIPHTH-ACELL PERTUSSIS 5-2.5-18.5 LF-MCG/0.5 IM SUSP
0.5000 mL | Freq: Once | INTRAMUSCULAR | Status: AC
  Administered 2016-09-24: 0.5 mL via INTRAMUSCULAR
  Filled 2016-09-24: qty 0.5

## 2016-09-24 MED ORDER — HYDROCODONE-ACETAMINOPHEN 10-325 MG PO TABS
1.0000 | ORAL_TABLET | ORAL | Status: DC | PRN
  Administered 2016-09-24 – 2016-09-30 (×12): 1 via ORAL
  Filled 2016-09-24 (×13): qty 1

## 2016-09-24 MED ORDER — LIDOCAINE-EPINEPHRINE 2 %-1:100000 IJ SOLN
30.0000 mL | Freq: Once | INTRAMUSCULAR | Status: AC
  Administered 2016-09-24: 30 mL
  Filled 2016-09-24: qty 2

## 2016-09-24 MED ORDER — PREDNISONE 5 MG PO TABS
5.0000 mg | ORAL_TABLET | Freq: Every day | ORAL | Status: DC
  Administered 2016-09-25 – 2016-09-30 (×6): 5 mg via ORAL
  Filled 2016-09-24 (×6): qty 1

## 2016-09-24 MED ORDER — AMITRIPTYLINE HCL 10 MG PO TABS
20.0000 mg | ORAL_TABLET | Freq: Every day | ORAL | Status: DC
  Administered 2016-09-24 – 2016-09-29 (×6): 20 mg via ORAL
  Filled 2016-09-24 (×7): qty 2

## 2016-09-24 MED ORDER — VANCOMYCIN HCL IN DEXTROSE 750-5 MG/150ML-% IV SOLN
750.0000 mg | INTRAVENOUS | Status: DC
  Administered 2016-09-25: 750 mg via INTRAVENOUS
  Filled 2016-09-24 (×2): qty 150

## 2016-09-24 MED ORDER — PIPERACILLIN-TAZOBACTAM 3.375 G IVPB
3.3750 g | Freq: Three times a day (TID) | INTRAVENOUS | Status: DC
  Administered 2016-09-24 – 2016-09-26 (×5): 3.375 g via INTRAVENOUS
  Filled 2016-09-24 (×6): qty 50

## 2016-09-24 MED ORDER — SODIUM CHLORIDE 0.9 % IV BOLUS (SEPSIS)
1000.0000 mL | Freq: Once | INTRAVENOUS | Status: AC
  Administered 2016-09-25: 1000 mL via INTRAVENOUS

## 2016-09-24 MED ORDER — IOPAMIDOL (ISOVUE-370) INJECTION 76%
100.0000 mL | Freq: Once | INTRAVENOUS | Status: AC | PRN
  Administered 2016-09-24: 100 mL via INTRAVENOUS

## 2016-09-24 MED ORDER — HYDROCODONE-ACETAMINOPHEN 5-325 MG PO TABS
ORAL_TABLET | ORAL | Status: AC
  Administered 2016-09-24: 2 via ORAL
  Filled 2016-09-24: qty 2

## 2016-09-24 MED ORDER — HYDROCORTISONE NA SUCCINATE PF 100 MG IJ SOLR
50.0000 mg | Freq: Once | INTRAMUSCULAR | Status: AC
  Administered 2016-09-24: 50 mg via INTRAVENOUS
  Filled 2016-09-24 (×2): qty 2

## 2016-09-24 NOTE — ED Triage Notes (Signed)
Pt fell down steps.  Pt hit her head.  Pt has multiple skin tears/lacerations to left leg with possible muscle involvement.  One skin tear/laceration to right upper arm.  Pt was recently admitted to the hospital for urosepsis.  Pt states she doesn't eat or drink any different.  Pt has multiple bruises from head to toe.

## 2016-09-24 NOTE — ED Provider Notes (Signed)
[facility] Provider Note   CSN: [id number] Arrival date & time: 09/24/16  1236     History   Chief Complaint Chief Complaint  Patient presents with  . Fall    HPI Allison Mendez is a 76 y.o. female.  HPI   76 year old female with a history of rheumatoid arthritis, status post AKA was going down the stairs today when she missed a step causing her to roll down the stairs.  She denies any loss of consciousness.  She notes multiple skin tears and bleeding to her lower and upper extremities.  She notes hematoma to the forehead.  She reports very neck pain on the left lateral side.  She denies any numbness tingling weakness, dizziness or headache.  She denies chest pain or shortness of breath, denies any abdominal pain hip pain.  She notes pain over the areas of the skin tear lacerations.  Patient denies any chest pain or shortness of breath.  Patient reports she was recently hospitalized for sepsis, reports she is not currently on any blood thinners. Pt notes that she is chronically  SOB slight worse over the last several days, worse with ambulation.     Past Medical History:  Diagnosis Date  . Hypertension   . PVD (peripheral vascular disease) (HCC)   . RA (rheumatoid arthritis) (HCC)   . Rheumatoid lung (HCC)   . S/P AKA (above knee amputation), right [facility])     Patient Active Problem List   Diagnosis Date Noted  . Pressure injury of skin 09/18/2016  . Hypertension   . Rheumatoid lung (HCC)     Past Surgical History:  Procedure Laterality Date  . APPENDECTOMY      OB History    No data available       Home Medications    Prior to Admission medications   Medication Sig Start Date End Date Taking? Authorizing Provider  alendronate (FOSAMAX) 70 MG tablet Take 70 mg by mouth once a week. 07/29/16   Historical Provider, MD  amitriptyline (ELAVIL) 10 MG tablet Take 20 mg by mouth at bedtime. 09/01/16   Historical Provider, MD  atorvastatin (LIPITOR) 20 MG tablet  Take 20 mg by mouth every evening. 09/01/16   Historical Provider, MD  carisoprodol (SOMA) 350 MG tablet Take 350 mg by mouth daily. 08/12/16   Historical Provider, MD  cefUROXime (CEFTIN) 500 MG tablet Take 1 tablet (500 mg total) by mouth 2 (two) times daily with a meal. 09/21/16   Lonia Blood, MD  cyclobenzaprine (FLEXERIL) 10 MG tablet Take 10 mg by mouth daily. 08/31/16   Historical Provider, MD  HYDROcodone-acetaminophen (NORCO) 10-325 MG tablet Take 1 tablet by mouth every 4 (four) hours as needed for moderate pain (Do not exceed 4 tablets per day).  08/27/16   Historical Provider, MD  ibuprofen (ADVIL,MOTRIN) 600 MG tablet Take 600 mg by mouth 3 (three) times daily.  08/19/16   Historical Provider, MD  LYRICA 75 MG capsule Take 75 mg by mouth 2 (two) times daily.  07/23/16   Historical Provider, MD  metoprolol succinate (TOPROL-XL) 25 MG 24 hr tablet Take 0.5 tablets (12.5 mg total) by mouth daily. 09/21/16   Lonia Blood, MD  predniSONE (DELTASONE) 1 MG tablet Take 5 mg by mouth daily. 08/20/16   Historical Provider, MD  temazepam (RESTORIL) 15 MG capsule Take 15 mg by mouth at bedtime as needed for sleep.  08/21/16   Historical Provider, MD  timolol (TIMOPTIC-XR) 0.5 % ophthalmic gel-forming  Place 1 drop into both eyes every morning.  08/05/16   Historical Provider, MD  zolpidem (AMBIEN) 5 MG tablet Take 5 mg by mouth at bedtime. 09/05/16   Historical Provider, MD    Family History Family History  Problem Relation Age of Onset  . Rheum arthritis Neg Hx     Social History Social History  Substance Use Topics  . Smoking status: Never Smoker  . Smokeless tobacco: Never Used  . Alcohol use No     Allergies   Patient has no known allergies.   Review of Systems Review of Systems  All other systems reviewed and are negative.   Physical Exam Updated Vital Signs BP (!) 128/101   Pulse (!) 120   Temp 97.7 F (36.5 C) (Oral)   Resp 18   Ht 5\' 7"  (1.702 m)   Wt 41.3 kg    SpO2 99%   BMI 14.25 kg/m   Physical Exam  Cardiovascular: Regular rhythm and normal heart sounds.   Pulmonary/Chest:  Diminished bilateral-poor effort  Musculoskeletal:  Numerous lacerations and skin tears to the left lower extremity-1 large 8 cm skin tear over the left distal thigh, 1 7 cm laceration and skin tear over the left anterior knee with no intra-articular involvement, large vertical skin tear over the left tibialis anterior with no muscular involvement approximately 10 cm long, 1 laceration along the lateral aspect of the left foot approximately 4 cm long  1 5 cm skin tear to the right upper extremity  Tenderness to palpation of the lower extremity over lacerations, chest nontender abdomen soft nontender hips stable  No CT or L-spine tenderness to palpation.  Nursing note and vitals reviewed.  ED Treatments / Results  Labs (all labs ordered are listed, but only abnormal results are displayed) Labs Reviewed  CBC WITH DIFFERENTIAL/PLATELET - Abnormal; Notable for the following:       Result Value   WBC 29.1 (*)    RBC 3.76 (*)    Neutro Abs 27.0 (*)    All other components within normal limits  BASIC METABOLIC PANEL - Abnormal; Notable for the following:    Potassium 3.2 (*)    Chloride 100 (*)    CO2 34 (*)    Glucose, Bld 109 (*)    Calcium 8.5 (*)    All other components within normal limits  URINALYSIS, ROUTINE W REFLEX MICROSCOPIC - Abnormal; Notable for the following:    Color, Urine AMBER (*)    Ketones, ur 15 (*)    Protein, ur 30 (*)    All other components within normal limits  URINALYSIS, MICROSCOPIC (REFLEX) - Abnormal; Notable for the following:    Bacteria, UA RARE (*)    Squamous Epithelial / LPF 0-5 (*)    All other components within normal limits  CBC - Abnormal; Notable for the following:    WBC 20.3 (*)    RBC 3.00 (*)    Hemoglobin 9.5 (*)    HCT 29.4 (*)    All other components within normal limits  CULTURE, BLOOD (ROUTINE X 2)    CULTURE, BLOOD (ROUTINE X 2)  I-STAT CG4 LACTIC ACID, ED    EKG  EKG Interpretation  Date/Time:  Wednesday September 24 2016 14:29:43 EDT Ventricular Rate:  136 PR Interval:    QRS Duration: 81 QT Interval:  302 QTC Calculation: 455 R Axis:   62 Text Interpretation:  Sinus tachycardia Atrial premature complexes ST depression, probably rate related No significant change since  last tracing Confirmed by FLOYD MD, DANIEL [id number]) on 09/24/2016 3:33:17 PM       Radiology Dg Chest 2 View  Result Date: 09/24/2016 CLINICAL DATA:  Fall down stairs today.  Initial encounter. EXAM: CHEST  2 VIEW COMPARISON:  09/17/2016 and prior exam FINDINGS: The cardiomediastinal silhouette is unremarkable. Elevation of the right hemidiaphragm and right basilar scarring again noted. Surgical clips overlying the right axilla noted. There is no evidence of focal airspace disease, pulmonary edema, suspicious pulmonary nodule/mass, pleural effusion, or pneumothorax. No acute bony abnormalities are identified. IMPRESSION: No active cardiopulmonary disease. Electronically Signed   By: Harmon Pier M.D.   On: 09/24/2016 14:09   Dg Tibia/fibula Left  Result Date: 09/24/2016 CLINICAL DATA:  Acute left lower leg pain following fall today. Initial encounter. EXAM: LEFT TIBIA AND FIBULA - 2 VIEW COMPARISON:  None FINDINGS: Lateral soft tissue swelling noted. There is no evidence of acute fracture, subluxation or dislocation. No focal bony lesions are present. Vascular calcifications are noted. IMPRESSION: Soft tissue swelling without acute bony abnormality. Electronically Signed   By: Harmon Pier M.D.   On: 09/24/2016 14:07   Ct Head Wo Contrast  Result Date: 09/24/2016 CLINICAL DATA:  Patient fell down some steps today, has abrasion to mid upper frontal region, neck pains when moving neck, hx of PVD, HTN, rheumatoid arthritis, no other complaints EXAM: CT HEAD WITHOUT CONTRAST CT CERVICAL SPINE WITHOUT CONTRAST TECHNIQUE:  Multidetector CT imaging of the head and cervical spine was performed following the standard protocol without intravenous contrast. Multiplanar CT image reconstructions of the cervical spine were also generated. COMPARISON:  09/17/2016 FINDINGS: CT HEAD FINDINGS Brain: No evidence of acute infarction, hemorrhage, extra-axial collection, ventriculomegaly, or mass effect. Generalized cerebral atrophy. Periventricular white matter low attenuation likely secondary to microangiopathy. Vascular: Cerebrovascular atherosclerotic calcifications are noted. Skull: Negative for fracture or focal lesion. Sinuses/Orbits: Visualized portions of the orbits are unremarkable. Visualized portions of the paranasal sinuses and mastoid air cells are unremarkable. Other: Left frontal scalp soft tissue swelling. CT CERVICAL SPINE FINDINGS Alignment: 3 mm anterolisthesis of C2 on C3 secondary to facet disease. 3 mm anterolisthesis of C3 on C4 secondary to facet disease. Skull base and vertebrae: No acute fracture. No primary bone lesion or focal pathologic process. Soft tissues and spinal canal: No prevertebral fluid or swelling. No visible canal hematoma. Disc levels: Osseous fusion across the C4-5 disc space. Severe degenerative disc disease with disc height loss at C5-6 and C6-7. Severe right and mild left facet arthropathy at C2-3. Severe left facet arthropathy with left foraminal stenosis at C3-4. Broad-based disc osteophyte complex at C4-5 with bilateral uncovertebral degenerative changes and right facet arthropathy resulting in bilateral foraminal stenosis. At C5-6 there is a broad-based disc osteophyte complex with bilateral uncovertebral degenerative changes and bilateral foraminal stenosis. At C6-7 there is a broad-based disc osteophyte complex with bilateral uncovertebral degenerative changes with bilateral foraminal stenosis. Upper chest: Lung apices are clear. Other: No fluid collection or hematoma. Bilateral carotid artery  atherosclerosis. IMPRESSION: 1. No acute intracranial pathology. 2. No acute osseous injury the cervical spine. 3. 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 Electronically Signed   By: Elige Ko   On: 09/24/2016 13:47   Ct Cervical Spine Wo Contrast  Result Date: 09/24/2016 CLINICAL DATA:  Patient fell down some steps today, has abrasion to mid upper frontal region, neck pains when moving neck, hx of PVD, HTN, rheumatoid arthritis, no other complaints EXAM: CT HEAD WITHOUT CONTRAST CT CERVICAL  SPINE WITHOUT CONTRAST TECHNIQUE: Multidetector CT imaging of the head and cervical spine was performed following the standard protocol without intravenous contrast. Multiplanar CT image reconstructions of the cervical spine were also generated. COMPARISON:  09/17/2016 FINDINGS: CT HEAD FINDINGS Brain: No evidence of acute infarction, hemorrhage, extra-axial collection, ventriculomegaly, or mass effect. Generalized cerebral atrophy. Periventricular white matter low attenuation likely secondary to microangiopathy. Vascular: Cerebrovascular atherosclerotic calcifications are noted. Skull: Negative for fracture or focal lesion. Sinuses/Orbits: Visualized portions of the orbits are unremarkable. Visualized portions of the paranasal sinuses and mastoid air cells are unremarkable. Other: Left frontal scalp soft tissue swelling. CT CERVICAL SPINE FINDINGS Alignment: 3 mm anterolisthesis of C2 on C3 secondary to facet disease. 3 mm anterolisthesis of C3 on C4 secondary to facet disease. Skull base and vertebrae: No acute fracture. No primary bone lesion or focal pathologic process. Soft tissues and spinal canal: No prevertebral fluid or swelling. No visible canal hematoma. Disc levels: Osseous fusion across the C4-5 disc space. Severe degenerative disc disease with disc height loss at C5-6 and C6-7. Severe right and mild left facet arthropathy at C2-3. Severe left facet arthropathy with left foraminal stenosis at  C3-4. Broad-based disc osteophyte complex at C4-5 with bilateral uncovertebral degenerative changes and right facet arthropathy resulting in bilateral foraminal stenosis. At C5-6 there is a broad-based disc osteophyte complex with bilateral uncovertebral degenerative changes and bilateral foraminal stenosis. At C6-7 there is a broad-based disc osteophyte complex with bilateral uncovertebral degenerative changes with bilateral foraminal stenosis. Upper chest: Lung apices are clear. Other: No fluid collection or hematoma. Bilateral carotid artery atherosclerosis. IMPRESSION: 1. No acute intracranial pathology. 2. No acute osseous injury the cervical spine. 3. 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 Electronically Signed   By: Elige Ko   On: 09/24/2016 13:47    Procedures  CRITICAL CARE Performed by: Thermon Leyland   Total critical care time: 60 minutes  Critical care time was exclusive of separately billable procedures and treating other patients.  Critical care was necessary to treat or prevent imminent or life-threatening deterioration.  Critical care was time spent personally by me on the following activities: development of treatment plan with patient and/or surrogate as well as nursing, discussions with consultants, evaluation of patient's response to treatment, examination of patient, obtaining history from patient or surrogate, ordering and performing treatments and interventions, ordering and review of laboratory studies, ordering and review of radiographic studies, pulse oximetry and re-evaluation of patient's condition.  Marland Kitchen.Laceration Repair Date/Time: 09/24/2016 5:37 PM Performed by: Curlene Dolphin, Aundrey Elahi Authorized by: Curlene Dolphin, Darrel Gloss   Consent:    Consent obtained:  Verbal   Consent given by:  Patient   Risks discussed:  Infection and pain Anesthesia (see MAR for exact dosages):    Anesthesia method:  Local infiltration   Local anesthetic:  Lidocaine 2% WITH  epi Laceration details:    Location:  Leg   Leg location:  L lower leg   Length (cm):  7 Repair type:    Repair type:  Simple Exploration:    Contaminated: no   Treatment:    Area cleansed with:  Saline   Amount of cleaning:  Standard   Visualized foreign bodies/material removed: no   Skin repair:    Repair method:  Sutures   Suture size:  4-0   Suture material:  Fast-absorbing gut   Number of sutures:  4 Approximation:    Approximation:  Loose Post-procedure details:    Dressing:  Non-adherent dressing .Marland KitchenLaceration Repair Date/Time: 09/24/2016  5:39 PM Performed by: Curlene Dolphin, Eyoel Throgmorton Authorized by: Curlene Dolphin, Rhyan Radler   Consent:    Consent obtained:  Verbal Anesthesia (see MAR for exact dosages):    Anesthesia method:  Local infiltration   Local anesthetic:  Lidocaine 2% WITH epi Laceration details:    Location:  Leg   Leg location:  R lower leg   Length (cm):  10 Repair type:    Repair type:  Simple Exploration:    Wound extent: no muscle damage noted and no vascular damage noted     Contaminated: no   Treatment:    Area cleansed with:  Saline   Amount of cleaning:  Standard   Irrigation solution:  Sterile water   Visualized foreign bodies/material removed: no   Skin repair:    Repair method:  Sutures   Suture size:  4-0   Suture material:  Fast-absorbing gut Approximation:    Approximation:  Loose   Vermilion border: well-aligned   Post-procedure details:    Dressing:  Non-adherent dressing .Marland KitchenLaceration Repair Date/Time: 09/24/2016 5:40 PM Performed by: Curlene Dolphin, Peony Barner Authorized by: Curlene Dolphin, Gertude Benito   Consent:    Consent obtained:  Verbal   Consent given by:  Patient Anesthesia (see MAR for exact dosages):    Anesthesia method:  Local infiltration   Local anesthetic:  Lidocaine 2% WITH epi Laceration details:    Location:  Shoulder/arm   Shoulder/arm location:  R upper arm   Length (cm):  5 Repair type:    Repair type:  Simple Exploration:    Wound  exploration: entire depth of wound probed and visualized     Wound extent: no muscle damage noted     Contaminated: no   Treatment:    Amount of cleaning:  Standard   Irrigation solution:  Sterile water   Visualized foreign bodies/material removed: no   Skin repair:    Repair method:  Sutures   Suture size:  4-0   Suture material:  Fast-absorbing gut Approximation:    Approximation:  Loose Post-procedure details:    Dressing:  Non-adherent dressing   (including critical care time)  Wounds were dressed and Mepitel    Medications Ordered in ED Medications  piperacillin-tazobactam (ZOSYN) IVPB 3.375 g (3.375 g Intravenous New Bag/Given 09/24/16 1717)  vancomycin (VANCOCIN) IVPB 1000 mg/200 mL premix (1,000 mg Intravenous New Bag/Given 09/24/16 1718)  vancomycin (VANCOCIN) IVPB 750 mg/150 ml premix (not administered)  piperacillin-tazobactam (ZOSYN) IVPB 3.375 g (not administered)  lidocaine-EPINEPHrine (XYLOCAINE W/EPI) 2 %-1:100000 (with pres) injection 30 mL (30 mLs Infiltration Given 09/24/16 1519)  Tdap (BOOSTRIX) injection 0.5 mL (0.5 mLs Intramuscular Given 09/24/16 1526)  sodium chloride 0.9 % bolus 1,000 mL (0 mLs Intravenous Stopped 09/24/16 1630)  HYDROcodone-acetaminophen (NORCO/VICODIN) 5-325 MG per tablet 2 tablet (2 tablets Oral Given 09/24/16 1619)  metoprolol tartrate (LOPRESSOR) tablet 12.5 mg (12.5 mg Oral Given 09/24/16 1619)  sodium chloride 0.9 % bolus 1,000 mL (1,000 mLs Intravenous New Bag/Given 09/24/16 1628)     Initial Impression / Assessment and Plan / ED Course  I have reviewed the triage vital signs and the nursing notes.  Pertinent labs & imaging results that were available during my care of the patient were reviewed by me and considered in my medical decision making (see chart for details).      Final Clinical Impressions(s) / ED Diagnoses   Final diagnoses:  Sepsis, due to unspecified organism [facility])     Assessment/Plan:   76 year old female  presents status post fall.  She has  numerous significant skin tears to the left lower extremity.  I see no major vessel involvement or muscular involvement.  I attempted wound repair here and was able to reapproximate some of the skin tears, but due to the extensive nature not all wounds were able to be approximated.  These wounds will be bandaged here.  Patient significantly tachycardic up into the 130s 140s while here.  She notes very minimal shortness of breath, slightly over her baseline.  Patient appears to be septic here with a significant elevation in white blood cells, tachycardia.  There is concern for pulmonary embolus in this otherwise ill patient.  She has recent hospitalizations with numerous falls.  Patient was started on sepsis protocol with fluid gestation, antibiotics.  Patient will receive CT PE to rule out pulmonary embolism, hospitalist consulted for hospital admission.        New Prescriptions New Prescriptions   No medications on file     Eyvonne Mechanic, PA-C 09/24/16 1745    Eyvonne Mechanic, PA-C 09/24/16 1750    Melene Plan, DO 09/25/16 [phone]

## 2016-09-24 NOTE — Progress Notes (Addendum)
Pharmacy Antibiotic Note  Allison Mendez is a 76 y.o. female admitted on 09/24/2016 with sepsis.  Pharmacy has been consulted for vancomycin and Zosyn dosing.  Vancomycin 1g IV x1 and Zosyn 3.375g IV x1 ordered by EDP.  Plan: -Vancomycin 750mg  IV q24h -Zosyn 3.375g IV q8h EI -Follow c/s, clinical progression, renal function, trough at steady state  Height: 5\' 7"  (170.2 cm) Weight: 91 lb (41.3 kg) IBW/kg (Calculated) : 61.6  Temp (24hrs), Avg:97.7 F (36.5 C), Min:97.7 F (36.5 C), Max:97.7 F (36.5 C)   Recent Labs Lab 09/17/16 2200 09/17/16 2221 09/17/16 2235 09/18/16 0729 09/19/16 0320 09/21/16 0447 09/24/16 1530 09/24/16 1625  WBC  --  11.0*  --  10.6* 7.2 7.9 29.1*  --   CREATININE 2.29*  --   --  1.52* 0.85 0.59 0.54  --   LATICACIDVEN  --   --  0.93  --   --   --   --  1.28    Estimated Creatinine Clearance: 40.2 mL/min (by C-G formula based on SCr of 0.54 mg/dL).    No Known Allergies  Antimicrobials this admission: Vancomycin 4/18  >>  Zosyn 4/18 >>   Dose adjustments this admission: n/a  Microbiology results: 4/18 BCx:    Thank you for allowing pharmacy to be a part of this patient's care.  Donnis Phaneuf D. Rossi Burdo, PharmD, BCPS Clinical Pharmacist Pager: [phone] 09/24/2016 5:08 PM

## 2016-09-24 NOTE — Progress Notes (Signed)
No charge note:   Discussed case with Everlene Farrier, PA at [facility] and accepted to SDU at [facility] for sepsis.   Patient recently discharged for sepsis and presented to the ED after a fall down stairs, noted to have multiple skin tears/lacerations and contusions, the last of which are currently being repaired in the ED. She was noted to have abnormal vital signs including sinus tachycardia to 130's improved mildly with 30cc/kg IV fluids, hypERtensive, and hypoxemic to 79% on room air, improved on 2L by [place]. WBC elevated to 29.1 initially, lactate reassuring at 1.28. No obvious nidus for infection on CXR or UA. CTA chest has been ordered and will be performed to rule out PE prior to transfer. Vanc/zosyn started. The patient is accepted to SDU with working diagnosis of sepsis.   Hazeline Junker, MD 09/24/2016 6:01 PM

## 2016-09-24 NOTE — ED Notes (Signed)
Silver colored watch given to visitor

## 2016-09-24 NOTE — H&P (Signed)
History and Physical    Allison Mendez WUJ:[id number] DOB: [birth year] DOA: 09/24/2016  Referring MD/NP/PA:   PCP: Pcp Not In System   Patient coming from:  The patient is coming from home.  At baseline, pt is partially dependent for most of ADL.   Chief Complaint: fall  HPI: Allison Mendez is a 76 y.o. female with medical history significant of s/p of R AKA, hypertension, hyperlipidemia, depression, rheumatoid arthritis, PVD, rheumatoid lung, dCHF, who presents with fall.  Patient was recently hospitalized from 4/11-4/15 due to UTI and Escherichia coli bacteremia (pansensitive) and CAP. Pt was treated with antibiotics and switched to Ceftin at discharge. She also had transaminitis, which was thought to be due to shock liver. Patient had  negative right upper quadrant ultrasound in hospital.  Pt state that she fell when she was using wheelchair moving in house in this AM, slipped and fell down stairs. She has multiple skin tears in left lower leg, right upper arm and skin abrasion in forehead. She has mild pain in left foot and left lowe leg. Patient denies LOC. No unilateral weakness, facial droop, slurred speech or hearing loss. Patient denies chest pain, SOB, cough, nausea, vomiting, diarrhea, abdominal pain, symptoms of UTI. No fever or chills. No hematemesis, hematuria or hematochezia.  ED Course: pt was found to have abnormal vital signs including sinus tachycardia to 130's improved mildly with 1L IV fluids, hypERtensive, and hypoxemic to 79% on room air, improved on 2L by Maury. WBC elevated to 29.1 initially, lactate1.28. Her hemoglobin was 12 on initial CBC-->down to 9.5 on the repeated CBC. Potassium is 3.2, creatinine normal, negative urinalysis, negative chest x-ray. CT head is negative for acute intracranial abnormalities. CT of C-spine is negative for acute issues, but showed degenerative disc disease. CT angiogram of chest is negative for PE, but showed trace right sided pleural effusion. X-ray  of left tibia/fibular is negative for bony fracture. Pt is admitted to SDU as inpt.  Review of Systems:   General: no fevers, chills, no changes in body weight, has poor appetite, has fatigue HEENT: no blurry vision, hearing changes or sore throat Respiratory: no dyspnea, coughing, wheezing CV: no chest pain, no palpitations GI: no nausea, vomiting, abdominal pain, diarrhea, constipation GU: no dysuria, burning on urination, increased urinary frequency, hematuria  Ext: no leg edema. s/p of R AKA. Has pain in left foot and left lower leg. Has hand deformity bilaterally. Neuro: no unilateral weakness, numbness, or tingling, no vision change or hearing loss Skin: has multiple skin tear in left leg and right upper arm MSK: No muscle spasm, no deformity, no limitation of range of movement in spin Heme: No easy bruising.  Travel history: No recent long distant travel.  Allergy: No Known Allergies  Past Medical History:  Diagnosis Date  . Hypertension   . PVD (peripheral vascular disease) (HCC)   . RA (rheumatoid arthritis) (HCC)   . Rheumatoid lung (HCC)   . S/P AKA (above knee amputation), right [facility])     Past Surgical History:  Procedure Laterality Date  . APPENDECTOMY      Social History:  reports that she has never smoked. She has never used smokeless tobacco. She reports that she does not drink alcohol or use drugs.  Family History:  Family History  Problem Relation Age of Onset  . Rheum arthritis Neg Hx      Prior to Admission medications   Medication Sig Start Date End Date Taking? Authorizing Provider  alendronate (FOSAMAX) 70  MG tablet Take 70 mg by mouth once a week. 07/29/16   Historical Provider, MD  amitriptyline (ELAVIL) 10 MG tablet Take 20 mg by mouth at bedtime. 09/01/16   Historical Provider, MD  atorvastatin (LIPITOR) 20 MG tablet Take 20 mg by mouth every evening. 09/01/16   Historical Provider, MD  carisoprodol (SOMA) 350 MG tablet Take 350 mg by mouth daily.  08/12/16   Historical Provider, MD  cefUROXime (CEFTIN) 500 MG tablet Take 1 tablet (500 mg total) by mouth 2 (two) times daily with a meal. 09/21/16   Lonia Blood, MD  cyclobenzaprine (FLEXERIL) 10 MG tablet Take 10 mg by mouth daily. 08/31/16   Historical Provider, MD  HYDROcodone-acetaminophen (NORCO) 10-325 MG tablet Take 1 tablet by mouth every 4 (four) hours as needed for moderate pain (Do not exceed 4 tablets per day).  08/27/16   Historical Provider, MD  ibuprofen (ADVIL,MOTRIN) 600 MG tablet Take 600 mg by mouth 3 (three) times daily.  08/19/16   Historical Provider, MD  LYRICA 75 MG capsule Take 75 mg by mouth 2 (two) times daily.  07/23/16   Historical Provider, MD  metoprolol succinate (TOPROL-XL) 25 MG 24 hr tablet Take 0.5 tablets (12.5 mg total) by mouth daily. 09/21/16   Lonia Blood, MD  predniSONE (DELTASONE) 1 MG tablet Take 5 mg by mouth daily. 08/20/16   Historical Provider, MD  temazepam (RESTORIL) 15 MG capsule Take 15 mg by mouth at bedtime as needed for sleep.  08/21/16   Historical Provider, MD  timolol (TIMOPTIC-XR) 0.5 % ophthalmic gel-forming Place 1 drop into both eyes every morning.  08/05/16   Historical Provider, MD  zolpidem (AMBIEN) 5 MG tablet Take 5 mg by mouth at bedtime. 09/05/16   Historical Provider, MD    Physical Exam: Vitals:   09/24/16 1845 09/24/16 1900 09/24/16 1930 09/24/16 2000  BP: (!) 129/92 (!) 128/96 122/88 116/82  Pulse: (!) 106 (!) 108 87 87  Resp: (!) 24 16 (!) 21 (!) 22  Temp:   97.9 F (36.6 C)   TempSrc:   Oral   SpO2: 99% 100% 100% 100%  Weight:      Height:       General: Not in acute distress HEENT:       Eyes: PERRL, EOMI, no scleral icterus.       ENT: No discharge from the ears and nose, no pharynx injection, no tonsillar enlargement.        Neck: No JVD, no bruit, no mass felt. Heme: No neck lymph node enlargement. Cardiac: S1/S2, RRR, No murmurs, No gallops or rubs. Respiratory: No rales, wheezing, rhonchi or  rubs. GI: Soft, nondistended, nontender, no rebound pain, no organomegaly, BS present. GU: No hematuria Ext: No pitting leg edema bilaterally. 2+DP/PT pulse on the left leg. s/p of R AKA. Has tenderness in left foot and left lower leg. Has hand deformity bilaterally. Musculoskeletal: No joint deformities, No joint redness or warmth, no limitation of ROM in spin. Skin:  has multiple skin tear in left leg and right upper arm Neuro: Alert, oriented X3, cranial nerves II-XII grossly intact, moves all extremities normally. Psych: Patient is not psychotic, no suicidal or hemocidal ideation.  Labs on Admission: I have personally reviewed following labs and imaging studies  CBC:  Recent Labs Lab 09/18/16 0729 09/19/16 0320 09/21/16 0447 09/24/16 1530 09/24/16 1711  WBC 10.6* 7.2 7.9 29.1* 20.3*  NEUTROABS  --   --   --  27.0*  --  HGB 13.0 11.2* 12.3 12.0 9.5*  HCT 39.1 34.5* 37.6 36.7 29.4*  MCV 96.8 95.8 95.9 97.6 98.0  PLT 131* 118* 160 266 188   Basic Metabolic Panel:  Recent Labs Lab 09/18/16 0729 09/19/16 0320 09/21/16 0447 09/24/16 1530  NA 141 139 142 141  K 3.7 3.7 3.1* 3.2*  CL 110 109 104 100*  CO2 18* 24 32 34*  GLUCOSE 85 90 110* 109*  BUN 35* 20 10 11   CREATININE 1.52* 0.85 0.59 0.54  CALCIUM 7.8* 8.2* 8.1* 8.5*  MG 1.8  --   --   --   PHOS 3.6  --   --   --    GFR: Estimated Creatinine Clearance: 40.2 mL/min (by C-G formula based on SCr of 0.54 mg/dL). Liver Function Tests:  Recent Labs Lab 09/18/16 0729 09/19/16 0320 09/21/16 0447  AST 250* 99* 30  ALT 707* 465* 210*  ALKPHOS 109 98 100  BILITOT 2.3* 1.5* 0.9  PROT 5.3* 4.8* 5.3*  ALBUMIN 2.7* 2.3* 2.4*    Recent Labs Lab 09/18/16 0729  LIPASE 22    Recent Labs Lab 09/18/16 0005  AMMONIA 13   Coagulation Profile:  Recent Labs Lab 09/18/16 0729 09/19/16 0320  INR 1.05 1.02   Cardiac Enzymes:  Recent Labs Lab 09/18/16 1238 09/18/16 1849 09/21/16 0447  TROPONINI 0.03*  0.16* 0.03*   BNP (last 3 results) No results for input(s): PROBNP in the last 8760 hours. HbA1C: No results for input(s): HGBA1C in the last 72 hours. CBG: No results for input(s): GLUCAP in the last 168 hours. Lipid Profile: No results for input(s): CHOL, HDL, LDLCALC, TRIG, CHOLHDL, LDLDIRECT in the last 72 hours. Thyroid Function Tests: No results for input(s): TSH, T4TOTAL, FREET4, T3FREE, THYROIDAB in the last 72 hours. Anemia Panel: No results for input(s): VITAMINB12, FOLATE, FERRITIN, TIBC, IRON, RETICCTPCT in the last 72 hours. Urine analysis:    Component Value Date/Time   COLORURINE AMBER (A) 09/24/2016 1623   APPEARANCEUR CLEAR 09/24/2016 1623   LABSPEC 1.024 09/24/2016 1623   PHURINE 6.0 09/24/2016 1623   GLUCOSEU NEGATIVE 09/24/2016 1623   HGBUR NEGATIVE 09/24/2016 1623   BILIRUBINUR NEGATIVE 09/24/2016 1623   KETONESUR 15 (A) 09/24/2016 1623   PROTEINUR 30 (A) 09/24/2016 1623   NITRITE NEGATIVE 09/24/2016 1623   LEUKOCYTESUR NEGATIVE 09/24/2016 1623   Sepsis Labs: @LABRCNTIP (procalcitonin:4,lacticidven:4) ) Recent Results (from the past 240 hour(s))  Blood culture (routine x 2)     Status: None   Collection Time: 09/17/16  9:49 PM  Result Value Ref Range Status   Specimen Description BLOOD LEFT HAND  Final   Special Requests   Final    BOTTLES DRAWN AEROBIC AND ANAEROBIC Blood Culture results may not be optimal due to an inadequate volume of blood received in culture bottles   Culture   Final    NO GROWTH 5 DAYS Performed at [facility], [address] [place], [address] [place]    Report Status 09/23/2016 FINAL  Final  Urine culture     Status: None   Collection Time: 09/17/16 10:16 PM  Result Value Ref Range Status   Specimen Description URINE, CATHETERIZED  Final   Special Requests NONE  Final   Culture   Final    NO GROWTH Performed at [facility], [address], [place], [address] [place]    Report Status 09/19/2016 FINAL   Final  Blood culture (routine x 2)     Status: Abnormal  Collection Time: 09/17/16 10:55 PM  Result Value Ref Range Status   Specimen Description BLOOD LEFT WRIST  Final   Special Requests BOTTLES DRAWN AEROBIC AND ANAEROBIC BCAV  Final   Culture  Setup Time   Final    GRAM NEGATIVE RODS AEROBIC BOTTLE ONLY CRITICAL RESULT CALLED TO, READ BACK BY AND VERIFIED WITH: A JOHNSTON PHARMD 2050 09/18/16 A BROWNING Performed at [facility], [address], [place], [place] [address]    Culture ESCHERICHIA COLI (A)  Final   Report Status 09/21/2016 FINAL  Final   Organism ID, Bacteria ESCHERICHIA COLI  Final      Susceptibility   Escherichia coli - MIC*    AMPICILLIN 4 SENSITIVE Sensitive     CEFAZOLIN <=4 SENSITIVE Sensitive     CEFEPIME <=1 SENSITIVE Sensitive     CEFTAZIDIME <=1 SENSITIVE Sensitive     CEFTRIAXONE <=1 SENSITIVE Sensitive     CIPROFLOXACIN <=0.25 SENSITIVE Sensitive     GENTAMICIN <=1 SENSITIVE Sensitive     IMIPENEM <=0.25 SENSITIVE Sensitive     TRIMETH/SULFA <=20 SENSITIVE Sensitive     AMPICILLIN/SULBACTAM <=2 SENSITIVE Sensitive     PIP/TAZO <=4 SENSITIVE Sensitive     Extended ESBL NEGATIVE Sensitive     * ESCHERICHIA COLI  Blood Culture ID Panel (Reflexed)     Status: Abnormal   Collection Time: 09/17/16 10:55 PM  Result Value Ref Range Status   Enterococcus species NOT DETECTED NOT DETECTED Final   Listeria monocytogenes NOT DETECTED NOT DETECTED Final   Staphylococcus species NOT DETECTED NOT DETECTED Final   Staphylococcus aureus NOT DETECTED NOT DETECTED Final   Streptococcus species NOT DETECTED NOT DETECTED Final   Streptococcus agalactiae NOT DETECTED NOT DETECTED Final   Streptococcus pneumoniae NOT DETECTED NOT DETECTED Final   Streptococcus pyogenes NOT DETECTED NOT DETECTED Final   Acinetobacter baumannii NOT DETECTED NOT DETECTED Final   Enterobacteriaceae species DETECTED (A) NOT DETECTED Final    Comment: Enterobacteriaceae represent a  large family of gram-negative bacteria, not a single organism. CRITICAL RESULT CALLED TO, READ BACK BY AND VERIFIED WITH: A JOHNSTON PHARMD 2050 09/18/16 A BROWNING    Enterobacter cloacae complex NOT DETECTED NOT DETECTED Final   Escherichia coli DETECTED (A) NOT DETECTED Final    Comment: CRITICAL RESULT CALLED TO, READ BACK BY AND VERIFIED WITH: A JOHNSTON PHARMD 2050 09/18/16 A BROWNING    Klebsiella oxytoca NOT DETECTED NOT DETECTED Final   Klebsiella pneumoniae NOT DETECTED NOT DETECTED Final   Proteus species NOT DETECTED NOT DETECTED Final   Serratia marcescens NOT DETECTED NOT DETECTED Final   Carbapenem resistance NOT DETECTED NOT DETECTED Final   Haemophilus influenzae NOT DETECTED NOT DETECTED Final   Neisseria meningitidis NOT DETECTED NOT DETECTED Final   Pseudomonas aeruginosa NOT DETECTED NOT DETECTED Final   Candida albicans NOT DETECTED NOT DETECTED Final   Candida glabrata NOT DETECTED NOT DETECTED Final   Candida krusei NOT DETECTED NOT DETECTED Final   Candida parapsilosis NOT DETECTED NOT DETECTED Final   Candida tropicalis NOT DETECTED NOT DETECTED Final    Comment: Performed at [facility], [address] [place], [place] [address]  MRSA PCR Screening     Status: None   Collection Time: 09/18/16  3:08 AM  Result Value Ref Range Status   MRSA by PCR NEGATIVE NEGATIVE Final    Comment:        The GeneXpert MRSA Assay (FDA approved for NASAL specimens only), is one  component of a comprehensive MRSA colonization surveillance program. It is not intended to diagnose MRSA infection nor to guide or monitor treatment for MRSA infections.   MRSA PCR Screening     Status: None   Collection Time: 09/24/16  9:11 PM  Result Value Ref Range Status   MRSA by PCR NEGATIVE NEGATIVE Final    Comment:        The GeneXpert MRSA Assay (FDA approved for NASAL specimens only), is one component of a comprehensive MRSA colonization surveillance program. It is  not intended to diagnose MRSA infection nor to guide or monitor treatment for MRSA infections.      Radiological Exams on Admission: Dg Chest 2 View  Result Date: 09/24/2016 CLINICAL DATA:  Fall down stairs today.  Initial encounter. EXAM: CHEST  2 VIEW COMPARISON:  09/17/2016 and prior exam FINDINGS: The cardiomediastinal silhouette is unremarkable. Elevation of the right hemidiaphragm and right basilar scarring again noted. Surgical clips overlying the right axilla noted. There is no evidence of focal airspace disease, pulmonary edema, suspicious pulmonary nodule/mass, pleural effusion, or pneumothorax. No acute bony abnormalities are identified. IMPRESSION: No active cardiopulmonary disease. Electronically Signed   By: Harmon Pier M.D.   On: 09/24/2016 14:09   Dg Tibia/fibula Left  Result Date: 09/24/2016 CLINICAL DATA:  Acute left lower leg pain following fall today. Initial encounter. EXAM: LEFT TIBIA AND FIBULA - 2 VIEW COMPARISON:  None FINDINGS: Lateral soft tissue swelling noted. There is no evidence of acute fracture, subluxation or dislocation. No focal bony lesions are present. Vascular calcifications are noted. IMPRESSION: Soft tissue swelling without acute bony abnormality. Electronically Signed   By: Harmon Pier M.D.   On: 09/24/2016 14:07   Ct Head Wo Contrast  Result Date: 09/24/2016 CLINICAL DATA:  Patient fell down some steps today, has abrasion to mid upper frontal region, neck pains when moving neck, hx of PVD, HTN, rheumatoid arthritis, no other complaints EXAM: CT HEAD WITHOUT CONTRAST CT CERVICAL SPINE WITHOUT CONTRAST TECHNIQUE: Multidetector CT imaging of the head and cervical spine was performed following the standard protocol without intravenous contrast. Multiplanar CT image reconstructions of the cervical spine were also generated. COMPARISON:  09/17/2016 FINDINGS: CT HEAD FINDINGS Brain: No evidence of acute infarction, hemorrhage, extra-axial collection,  ventriculomegaly, or mass effect. Generalized cerebral atrophy. Periventricular white matter low attenuation likely secondary to microangiopathy. Vascular: Cerebrovascular atherosclerotic calcifications are noted. Skull: Negative for fracture or focal lesion. Sinuses/Orbits: Visualized portions of the orbits are unremarkable. Visualized portions of the paranasal sinuses and mastoid air cells are unremarkable. Other: Left frontal scalp soft tissue swelling. CT CERVICAL SPINE FINDINGS Alignment: 3 mm anterolisthesis of C2 on C3 secondary to facet disease. 3 mm anterolisthesis of C3 on C4 secondary to facet disease. Skull base and vertebrae: No acute fracture. No primary bone lesion or focal pathologic process. Soft tissues and spinal canal: No prevertebral fluid or swelling. No visible canal hematoma. Disc levels: Osseous fusion across the C4-5 disc space. Severe degenerative disc disease with disc height loss at C5-6 and C6-7. Severe right and mild left facet arthropathy at C2-3. Severe left facet arthropathy with left foraminal stenosis at C3-4. Broad-based disc osteophyte complex at C4-5 with bilateral uncovertebral degenerative changes and right facet arthropathy resulting in bilateral foraminal stenosis. At C5-6 there is a broad-based disc osteophyte complex with bilateral uncovertebral degenerative changes and bilateral foraminal stenosis. At C6-7 there is a broad-based disc osteophyte complex with bilateral uncovertebral degenerative changes with bilateral foraminal stenosis. Upper chest: Lung  apices are clear. Other: No fluid collection or hematoma. Bilateral carotid artery atherosclerosis. IMPRESSION: 1. No acute intracranial pathology. 2. No acute osseous injury the cervical spine. 3. 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 Electronically Signed   By: Elige Ko   On: 09/24/2016 13:47   Ct Angio Chest Pe W And/or Wo Contrast  Result Date: 09/24/2016 CLINICAL DATA:  Acute onset of  intermittent shortness of breath. Initial encounter. EXAM: CT ANGIOGRAPHY CHEST WITH CONTRAST TECHNIQUE: Multidetector CT imaging of the chest was performed using the standard protocol during bolus administration of intravenous contrast. Multiplanar CT image reconstructions and MIPs were obtained to evaluate the vascular anatomy. CONTRAST:  100 mL of Isovue 370 IV contrast COMPARISON:  Chest radiograph performed earlier today at 1:30 p.m. FINDINGS: Cardiovascular:  There is no evidence of pulmonary embolus. The heart is normal in size. Scattered calcification is noted along the thoracic aorta and proximal great vessels. Mediastinum/Nodes: The mediastinum is unremarkable appearance. No mediastinal lymphadenopathy is seen. No pericardial effusion is identified. The thyroid gland is grossly unremarkable. No axillary lymphadenopathy is appreciated. Lungs/Pleura: A trace right pleural effusion is noted, with associated atelectasis. No pneumothorax is seen. No masses are identified. Upper Abdomen: The visualized portions of the liver and spleen are grossly unremarkable. A patent vascular stent is noted at the origin of the celiac trunk. Musculoskeletal: No acute osseous abnormalities are identified. Right convex thoracic scoliosis is noted. There is chronic deformity of the body of the sternum. The visualized musculature is unremarkable in appearance. Review of the MIP images confirms the above findings. IMPRESSION: 1. No evidence of pulmonary embolus. 2. Trace right pleural effusion, with associated atelectasis. 3. Scattered aortic atherosclerosis. 4. Right convex thoracic scoliosis noted. Electronically Signed   By: Roanna Raider M.D.   On: 09/24/2016 18:41   Ct Cervical Spine Wo Contrast  Result Date: 09/24/2016 CLINICAL DATA:  Patient fell down some steps today, has abrasion to mid upper frontal region, neck pains when moving neck, hx of PVD, HTN, rheumatoid arthritis, no other complaints EXAM: CT HEAD WITHOUT  CONTRAST CT CERVICAL SPINE WITHOUT CONTRAST TECHNIQUE: Multidetector CT imaging of the head and cervical spine was performed following the standard protocol without intravenous contrast. Multiplanar CT image reconstructions of the cervical spine were also generated. COMPARISON:  09/17/2016 FINDINGS: CT HEAD FINDINGS Brain: No evidence of acute infarction, hemorrhage, extra-axial collection, ventriculomegaly, or mass effect. Generalized cerebral atrophy. Periventricular white matter low attenuation likely secondary to microangiopathy. Vascular: Cerebrovascular atherosclerotic calcifications are noted. Skull: Negative for fracture or focal lesion. Sinuses/Orbits: Visualized portions of the orbits are unremarkable. Visualized portions of the paranasal sinuses and mastoid air cells are unremarkable. Other: Left frontal scalp soft tissue swelling. CT CERVICAL SPINE FINDINGS Alignment: 3 mm anterolisthesis of C2 on C3 secondary to facet disease. 3 mm anterolisthesis of C3 on C4 secondary to facet disease. Skull base and vertebrae: No acute fracture. No primary bone lesion or focal pathologic process. Soft tissues and spinal canal: No prevertebral fluid or swelling. No visible canal hematoma. Disc levels: Osseous fusion across the C4-5 disc space. Severe degenerative disc disease with disc height loss at C5-6 and C6-7. Severe right and mild left facet arthropathy at C2-3. Severe left facet arthropathy with left foraminal stenosis at C3-4. Broad-based disc osteophyte complex at C4-5 with bilateral uncovertebral degenerative changes and right facet arthropathy resulting in bilateral foraminal stenosis. At C5-6 there is a broad-based disc osteophyte complex with bilateral uncovertebral degenerative changes and bilateral foraminal stenosis. At C6-7  there is a broad-based disc osteophyte complex with bilateral uncovertebral degenerative changes with bilateral foraminal stenosis. Upper chest: Lung apices are clear. Other: No  fluid collection or hematoma. Bilateral carotid artery atherosclerosis. IMPRESSION: 1. No acute intracranial pathology. 2. No acute osseous injury the cervical spine. 3. 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 Electronically Signed   By: Elige Ko   On: 09/24/2016 13:47     EKG: Reviewed independently. Sinus rhythm, tachycardia, QTC 494, PAC.  Assessment/Plan Principal Problem:   Sepsis (HCC) Active Problems:   Hypertension   RA (rheumatoid arthritis) (HCC)   PVD (peripheral vascular disease) (HCC)   Normocytic anemia   Hypokalemia   Fall   Protein-calorie malnutrition, moderate (HCC)   Possilbe Sepsis: Patient admitted to critical care for sepsis with leukocytosis, tachycardia, tachypnea, but no fever. Lactic acid is normal. Currently hemodynamically stable. Source of infection is not clear. Patient does not have symptoms of UTI. Urinalysis negative. Chest x-ray negative for infiltration.  -will admit to SDU as inpt -IV vanco and zosyn -f/u Blood and urine culture -will get Procalcitonin and trend lactic acid levels per sepsis protocol. -IVF: 1L of NS bolus in ED, followed by 75 cc/h (patient has dCHF, limiting aggressive IV fluids treatment).  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 No LOC.  -will get X-ray of left foot -prn percocet for pain -wound care consult for skin tears  Acute respiratory failure with hypoxia: Oxygen desaturated to 79% on room air. Etiology is not clear. CT angiogram of chest is negative for PE. No pneumonia on chest x-ray. Her oxygen saturations 100% on room air currently. -Observe closely -prn albuterol nebs  Normocytic anemia: Her hemoglobin was 12 on initial CBC-->down to 9.5 on the repeated CBC. May be due to bleeding from skin tear. Patient denies hematochezia, hematemesis or hematuria. -will repeat CBC stat -check FOBT -INR/PTT/type & screen  HTN: bp 116/82 -continue metoprolol  Chronic diastolic  congestive heart failure: 2-D echo on 09/20/16 showed EF 60-65% with grade 2 diastolic dysfunction. Patient is not taking diuretics at home. No leg edema or JVD. CHF is compensated. -Continue metoprolol -Check BNP  RA (rheumatoid arthritis) (HCC): has had a deformity bilaterally. Patient is on prednisone 5 mg daily -Continue prednisone -Give Solu Cortef 50 mg 1 as stress dose -Check cortisol level in morning  Hypokalemia: K= 3.1 on admission. - Repleted - Check Mg level  Protein-calorie malnutrition, moderate (HCC): -consult to nutrition   DVT ppx: SCD only to the left leg Code Status: DNR (I discussed with patient about code status, explained the meaning of CODE STATUS clearly to her. Patient wants to be DNR) Family Communication: None at bed side.     Disposition Plan:  Anticipate discharge back to previous home environment Consults called:  none Admission status: SDU/inpation       Date of Service 09/24/2016    Lorretta Harp [facility] Pager [phone]  If 7PM-7AM, please contact night-coverage [email or web address] Password [facility] 09/24/2016, 10:37 PM

## 2016-09-24 NOTE — ED Notes (Signed)
Suture cart at bedside,  

## 2016-09-24 NOTE — ED Notes (Signed)
Pt to XR

## 2016-09-25 DIAGNOSIS — W19XXXD Unspecified fall, subsequent encounter: Secondary | ICD-10-CM

## 2016-09-25 DIAGNOSIS — E440 Moderate protein-calorie malnutrition: Secondary | ICD-10-CM

## 2016-09-25 LAB — TYPE AND SCREEN
ABO/RH(D): B POS
ANTIBODY SCREEN: NEGATIVE

## 2016-09-25 LAB — COMPREHENSIVE METABOLIC PANEL
ALK PHOS: 57 U/L (ref 38–126)
ALT: 36 U/L (ref 14–54)
ANION GAP: 15 (ref 5–15)
AST: 39 U/L (ref 15–41)
Albumin: 3.7 g/dL (ref 3.5–5.0)
BUN: 48 mg/dL — ABNORMAL HIGH (ref 6–20)
CALCIUM: 7.4 mg/dL — AB (ref 8.9–10.3)
CO2: 25 mmol/L (ref 22–32)
Chloride: 96 mmol/L — ABNORMAL LOW (ref 101–111)
Creatinine, Ser: 0.72 mg/dL (ref 0.44–1.00)
GLUCOSE: 164 mg/dL — AB (ref 65–99)
POTASSIUM: 2.9 mmol/L — AB (ref 3.5–5.1)
Sodium: 136 mmol/L (ref 135–145)
Total Bilirubin: 1.4 mg/dL — ABNORMAL HIGH (ref 0.3–1.2)
Total Protein: 6.1 g/dL — ABNORMAL LOW (ref 6.5–8.1)

## 2016-09-25 LAB — GLUCOSE, CAPILLARY
Glucose-Capillary: 114 mg/dL — ABNORMAL HIGH (ref 65–99)
Glucose-Capillary: 122 mg/dL — ABNORMAL HIGH (ref 65–99)

## 2016-09-25 LAB — PROTIME-INR
INR: 1.41
PROTHROMBIN TIME: 17.3 s — AB (ref 11.4–15.2)

## 2016-09-25 LAB — APTT: aPTT: 25 seconds (ref 24–36)

## 2016-09-25 LAB — MAGNESIUM: MAGNESIUM: 1.2 mg/dL — AB (ref 1.7–2.4)

## 2016-09-25 LAB — CORTISOL-AM, BLOOD: CORTISOL - AM: 32.9 ug/dL — AB (ref 6.7–22.6)

## 2016-09-25 LAB — PROCALCITONIN
PROCALCITONIN: 0.59 ng/mL
Procalcitonin: 0.4 ng/mL

## 2016-09-25 LAB — LACTIC ACID, PLASMA: LACTIC ACID, VENOUS: 1.1 mmol/L (ref 0.5–1.9)

## 2016-09-25 LAB — ABO/RH: ABO/RH(D): B POS

## 2016-09-25 MED ORDER — POTASSIUM CHLORIDE CRYS ER 20 MEQ PO TBCR
40.0000 meq | EXTENDED_RELEASE_TABLET | ORAL | Status: AC
Start: 2016-09-25 — End: 2016-09-25
  Administered 2016-09-25 (×3): 40 meq via ORAL
  Filled 2016-09-25 (×3): qty 2

## 2016-09-25 NOTE — Care Management Note (Signed)
Case Management Note  Patient Details  Name: Nyeli Holtmeyer MRN: [id number] Date of Birth: 1941-01-25  Subjective/Objective:      Fall at home from wheelchair/sepsis              Action/Plan:  From home Date:  September 25, 2016 Chart reviewed for concurrent status and case management needs. Will continue to follow patient progress. Discharge Planning: following for needs Expected discharge date: [id number] Marcelle Smiling, BSN, [place], [place]   [phone]   Expected Discharge Date:                  Expected Discharge Plan:  Home/Self Care  In-House Referral:     Discharge planning Services     Post Acute Care Choice:    Choice offered to:     DME Arranged:    DME Agency:     HH Arranged:    HH Agency:     Status of Service:  In process, will continue to follow  If discussed at Long Length of Stay Meetings, dates discussed:    Additional Comments:  Golda Acre, RN 09/25/2016, 8:48 AM

## 2016-09-25 NOTE — Progress Notes (Signed)
Initial Nutrition Assessment  DOCUMENTATION CODES:   Non-severe (moderate) malnutrition in context of chronic illness, Underweight  INTERVENTION:  - Diet advancement as medically feasible. - RD will order appropriate oral nutrition supplement and a snack each day with diet advancement.   NUTRITION DIAGNOSIS:   Malnutrition (moderate/non-severe) related to chronic illness (RA, s/p AKA) as evidenced by moderate depletions of muscle mass, moderate depletion of body fat.  GOAL:   Patient will meet greater than or equal to 90% of their needs  MONITOR:   Diet advancement, Weight trends, Labs, Skin  REASON FOR ASSESSMENT:   Consult Assessment of nutrition requirement/status  ASSESSMENT:   76 y.o. female with medical history significant of s/p of R AKA, hypertension, hyperlipidemia, depression, rheumatoid arthritis, PVD, rheumatoid lung, dCHF, who presents with fall.  Pt seen for consult. BMI indicates underweight status; adjusted BMI based on R AKA: 17.8 kg/m2. Pt has been NPO since admission. She was just d/c'ed from the hospital on Sunday (4/15) and, per rounds this AM, was diagnosed with sepsis 2/2 UTI during that admission. Pt admitted yesterday s/p fall. No family/visitors at bedside at time of RD visit.   Pt reports that ~1 year ago she was in [place] with a friend and that after a lunch meal she began having severe abdominal cramping. Since that time she has had decreased appetite and decreased PO intake. She always eats breakfast and then eats 1 other meal/day (2 meals/day total) and sometimes snacks, sometimes does not. She reports being a "night owl" and that she gets busy watching TV and movies and does not always think to have something more to eat each day. Unable to determine if pt drinks oral nutrition supplements, such as Ensure or Boost, at home.   She denies abdominal pain or nausea with PO intakes recently. She denies any chewing difficulties. She states that she has  difficulty with swallowing (items feel stuck in her esophagus) three or four times per year. Per her report, she previously had an EGD and was told there was no notable cause for her swallowing difficulty. When these episodes happen she coughs hard until item feels unstuck.  Physical assessment done to upper body only d/t R AKA and multiple wounds to LLE. Assessment shows moderate muscle and moderate fat wasting. Unable to assess hands d/t severe arthritis. Pt states that despite decreased appetite and intakes she does not feel she has lost weight; unable to state usual body weight. No previous weight hx available in the chart before this month.  Medications reviewed; 1000 units vitamin D/day, 50 mg Solu-cortef x1 dose yesterday, daily multivitamin with minerals, 40 mEq oral KCl x1 dose yesterday, 5 mg Deltasone/day. Labs reviewed; K; 2.9 mmol/L, Mg: 1.2 mg/dL, Cl: 96 mmol/L, BUN: 48 mg/dL, Ca: 7.4 mg/dL.  IVF: NS @ 75 mL/hr.    Diet Order:  Diet NPO time specified Except for: Sips with Meds, Ice Chips  Skin:   Stage 2 sacral pressure injury, Stage 1 L heel pressure injury, Lacerations/skin tears to: L knne, L thigh, L ankle, R arm.   Last BM:  PTA/unknown  Height:   Ht Readings from Last 1 Encounters:  09/25/16 5\' 4"  (1.626 m)    Weight:   Wt Readings from Last 1 Encounters:  09/25/16 94 lb 2.2 oz (42.7 kg)    Ideal Body Weight:  54.54 kg  BMI:  Body mass index is 16.16 kg/m.  Estimated Nutritional Needs:   Kcal:  1365-1580 (32-27 kcal/kg)  Protein:  50-60  grams  Fluid:  >/= 1.6 L/day  EDUCATION NEEDS:   No education needs identified at this time    Trenton Gammon, MS, RD, LDN, [facility] Inpatient Clinical Dietitian Pager # [phone] After hours/weekend pager # [phone]

## 2016-09-25 NOTE — Progress Notes (Addendum)
[facility] PROGRESS NOTE  Allison Mendez XHB:[id number] DOB: July 29, 1940 DOA: 09/24/2016 PCP: Pcp Not In System  Interim summary and history of present illness 76 year old female with medical history significant of right AKA, hypertension, hyperlipidemia, depression, rheumatoid arthritis, diastolic congestive heart failure and recent admission due to CAP, Escherichia coli UTI and Escherichia coli bacteremia. Who was at home recovering from these recent infection doing okay and without acute complaints; and experienced a mechanical fall down her sisters stairs while she was trying to transition herself off her wheelchair and into different level in the house. Patient denies any chest pain, shortness of breath, loss of consciousness or any other prodromic syndrome. In the ED she was found to be hypoxic, with elevated lactic acid, significant elevation of her WBCs, and with multiple skin lacerations, abrasions and bruises. Patient was also tachycardic and meeting SIRS criteria.  Assessment/Plan: 1-SIRS: with high concerns for early sepsis given recent infected admission. -Patient c blood ulture has remained without any growth, UA without signs of acute infection currently and no infiltrates or acute abnormalities on her chest x-ray. -Patient wbc's has improved with IV fluids  -No frank source of infection appreciated at this moment  -Will continue current antibiotics following Pro calcitonin and other infection markers.   -Will continue IV fluids and supportive care  -Will follow lactic acid   2-Mechanical fall -Will ask physical therapy to evaluate for recommendations and assessment of patient capacity for safe discharge.  3-acute respiratory failure with hypoxia -No acute abnormalities seen on chest x-ray -Patient has improved significantly with oxygen supplementation -Questionable condition and acute stress from the fall prior to admission. -Will monitor and wean oxygen as tolerated.  Patient did not use oxygen at baseline.  4-normocytic anemia -Patient with a decrease of her hemoglobin from recent blood work -Probably associated with blood loss with a skin tear -Patient denies hematuria, hematochezia, melena, hematemesis or any other source/signs of bleeding -Will follow hemoglobin treatment -For now will hold on anticoagulation  5-hypertension -Will continue metoprolol  6-chronic diastolic heart failure -Compensated and stable -No signs of fluid overload on exam or vascular congestion on chest x-ray -Will follow daily weights and strict intake and output  7-rheumatoid arthritis -Patient received Solu-Cortef 50 mg 1 as a stress dose -Cortisol level within normal limits -Home prednisone  8-hypokalemia -Will replete as needed and follow electrolytes trend -will check Mg level.  9-moderate protein calorie malnutrition -will follow nutritional service rec's for feeding supplements  Code Status: DO NOT RESUSCITATE Family Communication: no family at bedside Disposition Plan: to be determined. Will observe overnight in the stepdown unit; continue current IV antibiotics, IV fluids resuscitation and supportive care.   Consultants:   None  Procedures:   see Below for x-ray reports  Antibiotics:  Vancomycin and Zosyn 09/24/16  HPI/Subjective: Afebrile patient denies chest pain and reports no significant shortness of breath. No nausea, no vomiting. She expressed feeling okay  Objective: Vitals:   09/25/16 1100 09/25/16 1200  BP:    Pulse:    Resp: (!) 21   Temp:  98 F (36.7 C)   No intake or output data in the 24 hours ending 09/25/16 1352 Filed Weights   09/24/16 1304 09/25/16 0520  Weight: 41.3 kg (91 lb) 42.7 kg (94 lb 2.2 oz)    Exam:   General: In no acute distress, denies chest pain and express feeling all right given current circumstances. No abdominal pain, no nausea, no vomiting. Slightly tachypneic and using oxygen  supplementation.  Cardiovascular: slightly tachycardic, no rubs, no gallops, positive murmur on exam  Respiratory: no wheezing, no crackles, good air movement  Abdomen: soft, nontender, nondistended, positive bowel sounds  Musculoskeletal: multiple operations, skin tear and bruises appreciated on her extremities and face. +1+ pedal edema on her left lower extremity and positive right AKA. No joint swelling appreciated on multiple deformities affecting her foot and hands bilaterally (from RA).  Data Reviewed: Basic Metabolic Panel:  Recent Labs Lab 09/19/16 0320 09/21/16 0447 09/24/16 1530 09/25/16 0321  NA 139 142 141 136  K 3.7 3.1* 3.2* 2.9*  CL 109 104 100* 96*  CO2 24 32 34* 25  GLUCOSE 90 110* 109* 164*  BUN 20 10 11  48*  CREATININE 0.85 0.59 0.54 0.72  CALCIUM 8.2* 8.1* 8.5* 7.4*  MG  --   --   --  1.2*   Liver Function Tests:  Recent Labs Lab 09/19/16 0320 09/21/16 0447 09/24/16 2256 09/25/16 0321  AST 99* 30 123* 39  ALT 465* 210* 121* 36  ALKPHOS 98 100 148* 57  BILITOT 1.5* 0.9 2.9* 1.4*  PROT 4.8* 5.3* 5.0* 6.1*  ALBUMIN 2.3* 2.4* 2.6* 3.7   No results for input(s): LIPASE, AMYLASE in the last 168 hours. No results for input(s): AMMONIA in the last 168 hours. CBC:  Recent Labs Lab 09/19/16 0320 09/21/16 0447 09/24/16 1530 09/24/16 1711 09/24/16 2256  WBC 7.2 7.9 29.1* 20.3* 14.3*  NEUTROABS  --   --  27.0*  --   --   HGB 11.2* 12.3 12.0 9.5* 9.3*  HCT 34.5* 37.6 36.7 29.4* 28.3*  MCV 95.8 95.9 97.6 98.0 96.3  PLT 118* 160 266 188 197   Cardiac Enzymes:  Recent Labs Lab 09/18/16 1849 09/21/16 0447  TROPONINI 0.16* 0.03*   BNP (last 3 results)  Recent Labs  09/24/16 2256  BNP 96.0    ProBNP (last 3 results) No results for input(s): PROBNP in the last 8760 hours.  CBG:  Recent Labs Lab 09/25/16 0753  GLUCAP 114*    Recent Results (from the past 240 hour(s))  Blood culture (routine x 2)     Status: None   Collection  Time: 09/17/16  9:49 PM  Result Value Ref Range Status   Specimen Description BLOOD LEFT HAND  Final   Special Requests   Final    BOTTLES DRAWN AEROBIC AND ANAEROBIC Blood Culture results may not be optimal due to an inadequate volume of blood received in culture bottles   Culture   Final    NO GROWTH 5 DAYS Performed at [facility], [address] [place], [place]    Report Status 09/23/2016 FINAL  Final  Urine culture     Status: None   Collection Time: 09/17/16 10:16 PM  Result Value Ref Range Status   Specimen Description URINE, CATHETERIZED  Final   Special Requests NONE  Final   Culture   Final    NO GROWTH Performed at [facility], [address] [place], [place]    Report Status 09/19/2016 FINAL  Final  Blood culture (routine x 2)     Status: Abnormal   Collection Time: 09/17/16 10:55 PM  Result Value Ref Range Status   Specimen Description BLOOD LEFT WRIST  Final   Special Requests BOTTLES DRAWN AEROBIC AND ANAEROBIC BCAV  Final   Culture  Setup Time   Final    GRAM NEGATIVE RODS AEROBIC BOTTLE ONLY CRITICAL RESULT CALLED TO, READ  BACK BY AND VERIFIED WITH: A JOHNSTON PHARMD 2050 09/18/16 A BROWNING Performed at [facility], [address], [place], [place] [address]    Culture ESCHERICHIA COLI (A)  Final   Report Status 09/21/2016 FINAL  Final   Organism ID, Bacteria ESCHERICHIA COLI  Final      Susceptibility   Escherichia coli - MIC*    AMPICILLIN 4 SENSITIVE Sensitive     CEFAZOLIN <=4 SENSITIVE Sensitive     CEFEPIME <=1 SENSITIVE Sensitive     CEFTAZIDIME <=1 SENSITIVE Sensitive     CEFTRIAXONE <=1 SENSITIVE Sensitive     CIPROFLOXACIN <=0.25 SENSITIVE Sensitive     GENTAMICIN <=1 SENSITIVE Sensitive     IMIPENEM <=0.25 SENSITIVE Sensitive     TRIMETH/SULFA <=20 SENSITIVE Sensitive     AMPICILLIN/SULBACTAM <=2 SENSITIVE Sensitive     PIP/TAZO <=4 SENSITIVE Sensitive     Extended ESBL NEGATIVE Sensitive     *  ESCHERICHIA COLI  Blood Culture ID Panel (Reflexed)     Status: Abnormal   Collection Time: 09/17/16 10:55 PM  Result Value Ref Range Status   Enterococcus species NOT DETECTED NOT DETECTED Final   Listeria monocytogenes NOT DETECTED NOT DETECTED Final   Staphylococcus species NOT DETECTED NOT DETECTED Final   Staphylococcus aureus NOT DETECTED NOT DETECTED Final   Streptococcus species NOT DETECTED NOT DETECTED Final   Streptococcus agalactiae NOT DETECTED NOT DETECTED Final   Streptococcus pneumoniae NOT DETECTED NOT DETECTED Final   Streptococcus pyogenes NOT DETECTED NOT DETECTED Final   Acinetobacter baumannii NOT DETECTED NOT DETECTED Final   Enterobacteriaceae species DETECTED (A) NOT DETECTED Final    Comment: Enterobacteriaceae represent a large family of gram-negative bacteria, not a single organism. CRITICAL RESULT CALLED TO, READ BACK BY AND VERIFIED WITH: A JOHNSTON PHARMD 2050 09/18/16 A BROWNING    Enterobacter cloacae complex NOT DETECTED NOT DETECTED Final   Escherichia coli DETECTED (A) NOT DETECTED Final    Comment: CRITICAL RESULT CALLED TO, READ BACK BY AND VERIFIED WITH: A JOHNSTON PHARMD 2050 09/18/16 A BROWNING    Klebsiella oxytoca NOT DETECTED NOT DETECTED Final   Klebsiella pneumoniae NOT DETECTED NOT DETECTED Final   Proteus species NOT DETECTED NOT DETECTED Final   Serratia marcescens NOT DETECTED NOT DETECTED Final   Carbapenem resistance NOT DETECTED NOT DETECTED Final   Haemophilus influenzae NOT DETECTED NOT DETECTED Final   Neisseria meningitidis NOT DETECTED NOT DETECTED Final   Pseudomonas aeruginosa NOT DETECTED NOT DETECTED Final   Candida albicans NOT DETECTED NOT DETECTED Final   Candida glabrata NOT DETECTED NOT DETECTED Final   Candida krusei NOT DETECTED NOT DETECTED Final   Candida parapsilosis NOT DETECTED NOT DETECTED Final   Candida tropicalis NOT DETECTED NOT DETECTED Final    Comment: Performed at [facility], [address], [place], [place] [address]  MRSA PCR Screening     Status: None   Collection Time: 09/18/16  3:08 AM  Result Value Ref Range Status   MRSA by PCR NEGATIVE NEGATIVE Final    Comment:        The GeneXpert MRSA Assay (FDA approved for NASAL specimens only), is one component of a comprehensive MRSA colonization surveillance program. It is not intended to diagnose MRSA infection nor to guide or monitor treatment for MRSA infections.   Blood Culture (routine x 2)     Status: None (Preliminary result)   Collection Time: 09/24/16  4:23 PM  Result Value Ref Range Status  Specimen Description BLOOD LEFT ANTECUBITAL  Final   Special Requests   Final    BOTTLES DRAWN AEROBIC AND ANAEROBIC Blood Culture adequate volume   Culture   Final    NO GROWTH < 24 HOURS Performed at [facility], [address] [place], [place] [address]    Report Status PENDING  Incomplete  Blood Culture (routine x 2)     Status: None (Preliminary result)   Collection Time: 09/24/16  4:23 PM  Result Value Ref Range Status   Specimen Description BLOOD BLOOD LEFT HAND  Final   Special Requests IN PEDIATRIC BOTTLE Blood Culture adequate volume  Final   Culture   Final    NO GROWTH < 24 HOURS Performed at [facility], [address], [place], [place] [address]    Report Status PENDING  Incomplete  MRSA PCR Screening     Status: None   Collection Time: 09/24/16  9:11 PM  Result Value Ref Range Status   MRSA by PCR NEGATIVE NEGATIVE Final    Comment:        The GeneXpert MRSA Assay (FDA approved for NASAL specimens only), is one component of a comprehensive MRSA colonization surveillance program. It is not intended to diagnose MRSA infection nor to guide or monitor treatment for MRSA infections.      Studies: Dg Chest 2 View  Result Date: 09/24/2016 CLINICAL DATA:  Fall down stairs today.  Initial encounter. EXAM: CHEST  2 VIEW COMPARISON:  09/17/2016 and prior exam FINDINGS: The  cardiomediastinal silhouette is unremarkable. Elevation of the right hemidiaphragm and right basilar scarring again noted. Surgical clips overlying the right axilla noted. There is no evidence of focal airspace disease, pulmonary edema, suspicious pulmonary nodule/mass, pleural effusion, or pneumothorax. No acute bony abnormalities are identified. IMPRESSION: No active cardiopulmonary disease. Electronically Signed   By: Harmon Pier M.D.   On: 09/24/2016 14:09   Dg Tibia/fibula Left  Result Date: 09/24/2016 CLINICAL DATA:  Acute left lower leg pain following fall today. Initial encounter. EXAM: LEFT TIBIA AND FIBULA - 2 VIEW COMPARISON:  None FINDINGS: Lateral soft tissue swelling noted. There is no evidence of acute fracture, subluxation or dislocation. No focal bony lesions are present. Vascular calcifications are noted. IMPRESSION: Soft tissue swelling without acute bony abnormality. Electronically Signed   By: Harmon Pier M.D.   On: 09/24/2016 14:07   Ct Head Wo Contrast  Result Date: 09/24/2016 CLINICAL DATA:  Patient fell down some steps today, has abrasion to mid upper frontal region, neck pains when moving neck, hx of PVD, HTN, rheumatoid arthritis, no other complaints EXAM: CT HEAD WITHOUT CONTRAST CT CERVICAL SPINE WITHOUT CONTRAST TECHNIQUE: Multidetector CT imaging of the head and cervical spine was performed following the standard protocol without intravenous contrast. Multiplanar CT image reconstructions of the cervical spine were also generated. COMPARISON:  09/17/2016 FINDINGS: CT HEAD FINDINGS Brain: No evidence of acute infarction, hemorrhage, extra-axial collection, ventriculomegaly, or mass effect. Generalized cerebral atrophy. Periventricular white matter low attenuation likely secondary to microangiopathy. Vascular: Cerebrovascular atherosclerotic calcifications are noted. Skull: Negative for fracture or focal lesion. Sinuses/Orbits: Visualized portions of the orbits are unremarkable.  Visualized portions of the paranasal sinuses and mastoid air cells are unremarkable. Other: Left frontal scalp soft tissue swelling. CT CERVICAL SPINE FINDINGS Alignment: 3 mm anterolisthesis of C2 on C3 secondary to facet disease. 3 mm anterolisthesis of C3 on C4 secondary to facet disease. Skull base and vertebrae: No acute fracture. No primary bone  lesion or focal pathologic process. Soft tissues and spinal canal: No prevertebral fluid or swelling. No visible canal hematoma. Disc levels: Osseous fusion across the C4-5 disc space. Severe degenerative disc disease with disc height loss at C5-6 and C6-7. Severe right and mild left facet arthropathy at C2-3. Severe left facet arthropathy with left foraminal stenosis at C3-4. Broad-based disc osteophyte complex at C4-5 with bilateral uncovertebral degenerative changes and right facet arthropathy resulting in bilateral foraminal stenosis. At C5-6 there is a broad-based disc osteophyte complex with bilateral uncovertebral degenerative changes and bilateral foraminal stenosis. At C6-7 there is a broad-based disc osteophyte complex with bilateral uncovertebral degenerative changes with bilateral foraminal stenosis. Upper chest: Lung apices are clear. Other: No fluid collection or hematoma. Bilateral carotid artery atherosclerosis. IMPRESSION: 1. No acute intracranial pathology. 2. No acute osseous injury the cervical spine. 3. 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 Electronically Signed   By: Elige Ko   On: 09/24/2016 13:47   Ct Angio Chest Pe W And/or Wo Contrast  Result Date: 09/24/2016 CLINICAL DATA:  Acute onset of intermittent shortness of breath. Initial encounter. EXAM: CT ANGIOGRAPHY CHEST WITH CONTRAST TECHNIQUE: Multidetector CT imaging of the chest was performed using the standard protocol during bolus administration of intravenous contrast. Multiplanar CT image reconstructions and MIPs were obtained to evaluate the vascular anatomy.  CONTRAST:  100 mL of Isovue 370 IV contrast COMPARISON:  Chest radiograph performed earlier today at 1:30 p.m. FINDINGS: Cardiovascular:  There is no evidence of pulmonary embolus. The heart is normal in size. Scattered calcification is noted along the thoracic aorta and proximal great vessels. Mediastinum/Nodes: The mediastinum is unremarkable appearance. No mediastinal lymphadenopathy is seen. No pericardial effusion is identified. The thyroid gland is grossly unremarkable. No axillary lymphadenopathy is appreciated. Lungs/Pleura: A trace right pleural effusion is noted, with associated atelectasis. No pneumothorax is seen. No masses are identified. Upper Abdomen: The visualized portions of the liver and spleen are grossly unremarkable. A patent vascular stent is noted at the origin of the celiac trunk. Musculoskeletal: No acute osseous abnormalities are identified. Right convex thoracic scoliosis is noted. There is chronic deformity of the body of the sternum. The visualized musculature is unremarkable in appearance. Review of the MIP images confirms the above findings. IMPRESSION: 1. No evidence of pulmonary embolus. 2. Trace right pleural effusion, with associated atelectasis. 3. Scattered aortic atherosclerosis. 4. Right convex thoracic scoliosis noted. Electronically Signed   By: Roanna Raider M.D.   On: 09/24/2016 18:41   Ct Cervical Spine Wo Contrast  Result Date: 09/24/2016 CLINICAL DATA:  Patient fell down some steps today, has abrasion to mid upper frontal region, neck pains when moving neck, hx of PVD, HTN, rheumatoid arthritis, no other complaints EXAM: CT HEAD WITHOUT CONTRAST CT CERVICAL SPINE WITHOUT CONTRAST TECHNIQUE: Multidetector CT imaging of the head and cervical spine was performed following the standard protocol without intravenous contrast. Multiplanar CT image reconstructions of the cervical spine were also generated. COMPARISON:  09/17/2016 FINDINGS: CT HEAD FINDINGS Brain: No  evidence of acute infarction, hemorrhage, extra-axial collection, ventriculomegaly, or mass effect. Generalized cerebral atrophy. Periventricular white matter low attenuation likely secondary to microangiopathy. Vascular: Cerebrovascular atherosclerotic calcifications are noted. Skull: Negative for fracture or focal lesion. Sinuses/Orbits: Visualized portions of the orbits are unremarkable. Visualized portions of the paranasal sinuses and mastoid air cells are unremarkable. Other: Left frontal scalp soft tissue swelling. CT CERVICAL SPINE FINDINGS Alignment: 3 mm anterolisthesis of C2 on C3 secondary to facet disease. 3 mm  anterolisthesis of C3 on C4 secondary to facet disease. Skull base and vertebrae: No acute fracture. No primary bone lesion or focal pathologic process. Soft tissues and spinal canal: No prevertebral fluid or swelling. No visible canal hematoma. Disc levels: Osseous fusion across the C4-5 disc space. Severe degenerative disc disease with disc height loss at C5-6 and C6-7. Severe right and mild left facet arthropathy at C2-3. Severe left facet arthropathy with left foraminal stenosis at C3-4. Broad-based disc osteophyte complex at C4-5 with bilateral uncovertebral degenerative changes and right facet arthropathy resulting in bilateral foraminal stenosis. At C5-6 there is a broad-based disc osteophyte complex with bilateral uncovertebral degenerative changes and bilateral foraminal stenosis. At C6-7 there is a broad-based disc osteophyte complex with bilateral uncovertebral degenerative changes with bilateral foraminal stenosis. Upper chest: Lung apices are clear. Other: No fluid collection or hematoma. Bilateral carotid artery atherosclerosis. IMPRESSION: 1. No acute intracranial pathology. 2. No acute osseous injury the cervical spine. 3. 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 Electronically Signed   By: Elige Ko   On: 09/24/2016 13:47   Dg Foot Complete Left  Result Date:  09/24/2016 CLINICAL DATA:  Anterior left foot pain after a fall down steps this morning. EXAM: LEFT FOOT - COMPLETE 3+ VIEW COMPARISON:  None. FINDINGS: Diffuse bone demineralization. Deformity of the distal left fifth metatarsal bone may be due to postoperative change or bone resorption from erosive arthritic process. Soft tissue swelling over the dorsum of the foot. Diffuse vascular calcifications. No evidence of acute fracture or dislocation. No focal bone lesion. IMPRESSION: Diffuse bone demineralization. No acute fractures identified. Old resection or resorption of the distal fifth metatarsal bone. Electronically Signed   By: Burman Nieves M.D.   On: 09/24/2016 23:35    Scheduled Meds: . amitriptyline  20 mg Oral QHS  . atorvastatin  20 mg Oral QPM  . bifidobacterium infantis  1 capsule Oral Daily  . cholecalciferol  1,000 Units Oral Daily  . cyclobenzaprine  10 mg Oral Daily  . metoprolol succinate  12.5 mg Oral Daily  . multivitamin with minerals  1 tablet Oral Daily  . potassium chloride  40 mEq Oral Q4H  . predniSONE  5 mg Oral Daily  . sodium chloride flush  3 mL Intravenous Q12H  . timolol  1 drop Both Eyes Daily   Continuous Infusions: . sodium chloride 75 mL/hr at 09/24/16 2200  . piperacillin-tazobactam (ZOSYN)  IV 3.375 g (09/25/16 0552)  . vancomycin      Principal Problem:   Sepsis (HCC) Active Problems:   Hypertension   RA (rheumatoid arthritis) (HCC)   PVD (peripheral vascular disease) (HCC)   Normocytic anemia   Hypokalemia   Fall   Protein-calorie malnutrition, moderate (HCC)    Time spent: 25 minutes    Vassie Loll  [facility] Pager [phone]. If 7PM-7AM, please contact night-coverage at [email or web address], password [facility] 09/25/2016, 1:52 PM  LOS: 1 day

## 2016-09-25 NOTE — Consult Note (Signed)
WOC Nurse wound consult note Reason for Consult:Numerous and severe skin tears to right arm, left thigh, left knee laceration, skin tears to the left anterior LE, bilateral ankles. Full thickness wounds Wound type: trauma Pressure Injury POA: Yes Measurement:Numerous skin tears, avulsions that have been re approximated in ED where possible. Left thigh:  8cm x 4cm x 0.1cm Left knee laceration is 3.5cm in length Left anterior LE (pretibial): 10cm x 5cm x 0.1cm Left lateral ankle 1cm x 1cm x 0.1cm Left medial ankle: 3.5cm x 3cm x 0.1cm Thoracic spine with blanching erythema measuring 4cm x 3cm (no wound) Wound bed:red, moist Drainage (amount, consistency, odor) serosanguinous Periwound: Dry Dressing procedure/placement/frequency: I have provided Nursing with guidance for topical care using a silicone nonadherent dressing, topped with an ABD pad and secured with kerlix.  I have provided a pressure redistribution heel boot and orders for a therapeutic mattress replacement with low air loss feature upon transfer to the floor (she is on one here in ICU). Patient is at high risk for skin breakdown and all preventative measures are in place, including turning and repositioning. Patient and sister educated on pressure redistribution as a pressure injury prevention strategy. WOC nursing team will not follow, but will remain available to this patient, the nursing and medical teams.  Please re-consult if needed. Thanks, Ladona Mow, MSN, RN, GNP, [place]  Pager# [phone]

## 2016-09-26 DIAGNOSIS — I739 Peripheral vascular disease, unspecified: Secondary | ICD-10-CM

## 2016-09-26 DIAGNOSIS — I5032 Chronic diastolic (congestive) heart failure: Secondary | ICD-10-CM

## 2016-09-26 LAB — CBC
HCT: 25 % — ABNORMAL LOW (ref 36.0–46.0)
Hemoglobin: 8.1 g/dL — ABNORMAL LOW (ref 12.0–15.0)
MCH: 32.1 pg (ref 26.0–34.0)
MCHC: 32.4 g/dL (ref 30.0–36.0)
MCV: 99.2 fL (ref 78.0–100.0)
Platelets: 231 10*3/uL (ref 150–400)
RBC: 2.52 MIL/uL — AB (ref 3.87–5.11)
RDW: 14.1 % (ref 11.5–15.5)
WBC: 13.7 10*3/uL — AB (ref 4.0–10.5)

## 2016-09-26 LAB — BASIC METABOLIC PANEL
Anion gap: 5 (ref 5–15)
BUN: 8 mg/dL (ref 6–20)
CALCIUM: 7.6 mg/dL — AB (ref 8.9–10.3)
CHLORIDE: 108 mmol/L (ref 101–111)
CO2: 27 mmol/L (ref 22–32)
CREATININE: 0.37 mg/dL — AB (ref 0.44–1.00)
GFR calc non Af Amer: >60 mL/min (ref >60)
Glucose, Bld: 107 mg/dL — ABNORMAL HIGH (ref 65–99)
Potassium: 4.8 mmol/L (ref 3.5–5.1)
SODIUM: 140 mmol/L (ref 135–145)

## 2016-09-26 LAB — GLUCOSE, CAPILLARY: GLUCOSE-CAPILLARY: 84 mg/dL (ref 65–99)

## 2016-09-26 LAB — MAGNESIUM: MAGNESIUM: 1.6 mg/dL — AB (ref 1.7–2.4)

## 2016-09-26 MED ORDER — MAGNESIUM SULFATE 2 GM/50ML IV SOLN
2.0000 g | Freq: Once | INTRAVENOUS | Status: AC
  Administered 2016-09-26: 2 g via INTRAVENOUS
  Filled 2016-09-26: qty 50

## 2016-09-26 MED ORDER — CEFUROXIME AXETIL 500 MG PO TABS
500.0000 mg | ORAL_TABLET | Freq: Two times a day (BID) | ORAL | Status: DC
  Administered 2016-09-26 – 2016-09-28 (×5): 500 mg via ORAL
  Filled 2016-09-26 (×7): qty 1

## 2016-09-26 MED ORDER — METOPROLOL TARTRATE 25 MG PO TABS
25.0000 mg | ORAL_TABLET | Freq: Two times a day (BID) | ORAL | Status: DC
  Administered 2016-09-26 – 2016-09-30 (×8): 25 mg via ORAL
  Filled 2016-09-26 (×8): qty 1

## 2016-09-26 NOTE — Progress Notes (Signed)
[facility] PROGRESS NOTE  Allison Mendez RFX:[id number] DOB: Mar 17, 1941 DOA: 09/24/2016 PCP: Pcp Not In System  Interim summary and history of present illness 76 year old female with medical history significant of right AKA, hypertension, hyperlipidemia, depression, rheumatoid arthritis, diastolic congestive heart failure and recent admission due to CAP, Escherichia coli UTI and Escherichia coli bacteremia. Who was at home recovering from these recent infection doing okay and without acute complaints; and experienced a mechanical fall down her sisters stairs while she was trying to transition herself off her wheelchair and into different level in the house. Patient denies any chest pain, shortness of breath, loss of consciousness or any other prodromic syndrome. In the ED she was found to be hypoxic, with elevated lactic acid, significant elevation of her WBCs, and with multiple skin lacerations, abrasions and bruises. Patient was also tachycardic and meeting SIRS criteria.  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 -Patient blood culture has remained without any growth, UA without signs of acute infection and no infiltrates or acute abnormalities on her chest x-ray seen on admission. -Patient wbc's has continue improving and pro-calcitonin is 0.4  -No frank source of infection appreciated at this moment  -Will narrow antibiotic to ceftin PO and treat for 3 more days; to complete treatment as indicated for recent E. Coli UTI and bacteremia.  -Will continue IV fluids, but will decrease rate -continue supportive care  -repeat lactic acid is normal now   2-Mechanical fall -Will ask physical therapy to evaluate for recommendations and assessment of patient capacity for safe discharge. -patient is declining any facility for rehab at this moment during my evaluation.   3-acute respiratory  failure with hypoxia -No acute abnormalities seen on chest x-ray -Patient has improved significantly with oxygen supplementation and currently using only 1 L of oxygen with 96-100 sat -will continue weaning oxygen off -questionable if initial measurement was accurate or not -Patient did not use oxygen at baseline.  4-normocytic anemia -Patient with a decrease of her hemoglobin from recent blood work -Probably associated with blood loss with skin tear -Patient denies hematuria, hematochezia, melena, hematemesis or any other source/signs of bleeding prior to admission. No bleeding , other than oozing from wound has been seen here either.  -Will follow hemoglobin trend  5-hypertension and tachycardia  -Will continue metoprolol -dose adjusted for better control  6-chronic diastolic heart failure -Compensated and stable currently  -No signs of fluid overload on exam or vascular congestion appreciated on chest x-ray -Will follow daily weights and strict intake and output  7-rheumatoid arthritis -Patient received Solu-Cortef 50 mg 1 as a stress dose on admission  -Cortisol level within normal limits -will continue Home prednisone regimen   8-hypokalemia -will replete as needed an continue electrolyte monitoring  -K 4.8 today  9-moderate protein calorie malnutrition -will follow nutritional service rec's for feeding supplements -patient encourage to follow good hydration and PO intake   10-Numerous and severe skin tears to right arm, left thigh, left knee laceration, skin tears to the left anterior LE, bilateral ankles. -no superimposed infections seen -will follow wound care recommendations   Code Status: DO NOT RESUSCITATE Family Communication: no family at bedside; sister called twice and unable to reach. Disposition Plan: to be determined. Will follow physical therapy assessment and recommendations. Will transfer to telemetry bed. Narrow antibiotics and follow clinical response.     Consultants:   None  Procedures:   see Below for x-ray  reports  Antibiotics:  Vancomycin and Zosyn 09/24/16>>09/26/16  Ceftin 4/20  HPI/Subjective: Patient is afebrile, in no acute distress and feeling ok. She is frustrated with the fact that her family is trying to pursuit rehabilitation in a facility (as she would not like that). Denies CP, nausea, vomiting, abd pain, dysuria or any other complaints.   Objective: Vitals:   09/26/16 0800 09/26/16 0914  BP:  (!) 117/42  Pulse:  (!) 122  Resp:    Temp: 97.4 F (36.3 C)     Intake/Output Summary (Last 24 hours) at 09/26/16 0940 Last data filed at 09/26/16 0900  Gross per 24 hour  Intake             1325 ml  Output              850 ml  Net              475 ml   Filed Weights   09/24/16 1304 09/25/16 0520  Weight: 41.3 kg (91 lb) 42.7 kg (94 lb 2.2 oz)    Exam:   General: In no acute distress, denies chest pain, SOB, nausea, vomiting or any other complaints. Patient with frustrated and depressed mood given preliminary discussion about potential need for SNF for rehab. Using 1L oxygen supplementation.  Cardiovascular: slightly tachycardic, no JVD, no rubs, no gallops. Patient with positive murmur on exam.   Respiratory: good air movement, no wheezing, no crackles.   Abdomen: soft, NT, ND, positive BS  Musculoskeletal: multiple open skin tears and bruises appreciated on her extremities and face. +1+ pedal edema on her left lower extremity and positive right AKA. No joint swelling appreciated on her joints, multiple deformities affecting her foot and hands bilaterally (from RA). There is no signs of superimposed infections in her wounds. Serosanguineous oozing appreciated as part of drainage.   Data Reviewed: Basic Metabolic Panel:  Recent Labs Lab 09/21/16 0447 09/24/16 1530 09/25/16 0321 09/26/16 0215  NA 142 141 136 140  K 3.1* 3.2* 2.9* 4.8  CL 104 100* 96* 108  CO2 32 34* 25 27  GLUCOSE 110* 109*  164* 107*  BUN 10 11 48* 8  CREATININE 0.59 0.54 0.72 0.37*  CALCIUM 8.1* 8.5* 7.4* 7.6*  MG  --   --  1.2* 1.6*   Liver Function Tests:  Recent Labs Lab 09/21/16 0447 09/24/16 2256 09/25/16 0321  AST 30 123* 39  ALT 210* 121* 36  ALKPHOS 100 148* 57  BILITOT 0.9 2.9* 1.4*  PROT 5.3* 5.0* 6.1*  ALBUMIN 2.4* 2.6* 3.7   No results for input(s): LIPASE, AMYLASE in the last 168 hours. No results for input(s): AMMONIA in the last 168 hours. CBC:  Recent Labs Lab 09/21/16 0447 09/24/16 1530 09/24/16 1711 09/24/16 2256 09/26/16 0323  WBC 7.9 29.1* 20.3* 14.3* 13.7*  NEUTROABS  --  27.0*  --   --   --   HGB 12.3 12.0 9.5* 9.3* 8.1*  HCT 37.6 36.7 29.4* 28.3* 25.0*  MCV 95.9 97.6 98.0 96.3 99.2  PLT 160 266 188 197 231   Cardiac Enzymes:  Recent Labs Lab 09/21/16 0447  TROPONINI 0.03*   BNP (last 3 results)  Recent Labs  09/24/16 2256  BNP 96.0    CBG:  Recent Labs Lab 09/25/16 0753 09/25/16 2112 09/26/16 0753  GLUCAP 114* 122* 84    Recent Results (from the past 240 hour(s))  Blood culture (routine x 2)     Status: None   Collection  Time: 09/17/16  9:49 PM  Result Value Ref Range Status   Specimen Description BLOOD LEFT HAND  Final   Special Requests   Final    BOTTLES DRAWN AEROBIC AND ANAEROBIC Blood Culture results may not be optimal due to an inadequate volume of blood received in culture bottles   Culture   Final    NO GROWTH 5 DAYS Performed at [facility], [address] [place], [place] [address]    Report Status 09/23/2016 FINAL  Final  Urine culture     Status: None   Collection Time: 09/17/16 10:16 PM  Result Value Ref Range Status   Specimen Description URINE, CATHETERIZED  Final   Special Requests NONE  Final   Culture   Final    NO GROWTH Performed at [facility], [address], [place], [place] [address]    Report Status 09/19/2016 FINAL  Final  Blood culture (routine x 2)     Status: Abnormal   Collection  Time: 09/17/16 10:55 PM  Result Value Ref Range Status   Specimen Description BLOOD LEFT WRIST  Final   Special Requests BOTTLES DRAWN AEROBIC AND ANAEROBIC BCAV  Final   Culture  Setup Time   Final    GRAM NEGATIVE RODS AEROBIC BOTTLE ONLY CRITICAL RESULT CALLED TO, READ BACK BY AND VERIFIED WITH: A JOHNSTON PHARMD 2050 09/18/16 A BROWNING Performed at [facility], [address] [place], [place] [address]    Culture ESCHERICHIA COLI (A)  Final   Report Status 09/21/2016 FINAL  Final   Organism ID, Bacteria ESCHERICHIA COLI  Final      Susceptibility   Escherichia coli - MIC*    AMPICILLIN 4 SENSITIVE Sensitive     CEFAZOLIN <=4 SENSITIVE Sensitive     CEFEPIME <=1 SENSITIVE Sensitive     CEFTAZIDIME <=1 SENSITIVE Sensitive     CEFTRIAXONE <=1 SENSITIVE Sensitive     CIPROFLOXACIN <=0.25 SENSITIVE Sensitive     GENTAMICIN <=1 SENSITIVE Sensitive     IMIPENEM <=0.25 SENSITIVE Sensitive     TRIMETH/SULFA <=20 SENSITIVE Sensitive     AMPICILLIN/SULBACTAM <=2 SENSITIVE Sensitive     PIP/TAZO <=4 SENSITIVE Sensitive     Extended ESBL NEGATIVE Sensitive     * ESCHERICHIA COLI  Blood Culture ID Panel (Reflexed)     Status: Abnormal   Collection Time: 09/17/16 10:55 PM  Result Value Ref Range Status   Enterococcus species NOT DETECTED NOT DETECTED Final   Listeria monocytogenes NOT DETECTED NOT DETECTED Final   Staphylococcus species NOT DETECTED NOT DETECTED Final   Staphylococcus aureus NOT DETECTED NOT DETECTED Final   Streptococcus species NOT DETECTED NOT DETECTED Final   Streptococcus agalactiae NOT DETECTED NOT DETECTED Final   Streptococcus pneumoniae NOT DETECTED NOT DETECTED Final   Streptococcus pyogenes NOT DETECTED NOT DETECTED Final   Acinetobacter baumannii NOT DETECTED NOT DETECTED Final   Enterobacteriaceae species DETECTED (A) NOT DETECTED Final    Comment: Enterobacteriaceae represent a large family of gram-negative bacteria, not a single  organism. CRITICAL RESULT CALLED TO, READ BACK BY AND VERIFIED WITH: A JOHNSTON PHARMD 2050 09/18/16 A BROWNING    Enterobacter cloacae complex NOT DETECTED NOT DETECTED Final   Escherichia coli DETECTED (A) NOT DETECTED Final    Comment: CRITICAL RESULT CALLED TO, READ BACK BY AND VERIFIED WITH: A JOHNSTON PHARMD 2050 09/18/16 A BROWNING    Klebsiella oxytoca NOT DETECTED NOT DETECTED Final   Klebsiella pneumoniae NOT DETECTED NOT  DETECTED Final   Proteus species NOT DETECTED NOT DETECTED Final   Serratia marcescens NOT DETECTED NOT DETECTED Final   Carbapenem resistance NOT DETECTED NOT DETECTED Final   Haemophilus influenzae NOT DETECTED NOT DETECTED Final   Neisseria meningitidis NOT DETECTED NOT DETECTED Final   Pseudomonas aeruginosa NOT DETECTED NOT DETECTED Final   Candida albicans NOT DETECTED NOT DETECTED Final   Candida glabrata NOT DETECTED NOT DETECTED Final   Candida krusei NOT DETECTED NOT DETECTED Final   Candida parapsilosis NOT DETECTED NOT DETECTED Final   Candida tropicalis NOT DETECTED NOT DETECTED Final    Comment: Performed at [facility], [address], [place], [place] [address]  MRSA PCR Screening     Status: None   Collection Time: 09/18/16  3:08 AM  Result Value Ref Range Status   MRSA by PCR NEGATIVE NEGATIVE Final    Comment:        The GeneXpert MRSA Assay (FDA approved for NASAL specimens only), is one component of a comprehensive MRSA colonization surveillance program. It is not intended to diagnose MRSA infection nor to guide or monitor treatment for MRSA infections.   Blood Culture (routine x 2)     Status: None (Preliminary result)   Collection Time: 09/24/16  4:23 PM  Result Value Ref Range Status   Specimen Description BLOOD LEFT ANTECUBITAL  Final   Special Requests   Final    BOTTLES DRAWN AEROBIC AND ANAEROBIC Blood Culture adequate volume   Culture   Final    NO GROWTH < 24 HOURS Performed at [facility], [address] [place], [place] [address]    Report Status PENDING  Incomplete  Blood Culture (routine x 2)     Status: None (Preliminary result)   Collection Time: 09/24/16  4:23 PM  Result Value Ref Range Status   Specimen Description BLOOD BLOOD LEFT HAND  Final   Special Requests IN PEDIATRIC BOTTLE Blood Culture adequate volume  Final   Culture   Final    NO GROWTH < 24 HOURS Performed at [facility], [address] [place], [place] [address]    Report Status PENDING  Incomplete  MRSA PCR Screening     Status: None   Collection Time: 09/24/16  9:11 PM  Result Value Ref Range Status   MRSA by PCR NEGATIVE NEGATIVE Final    Comment:        The GeneXpert MRSA Assay (FDA approved for NASAL specimens only), is one component of a comprehensive MRSA colonization surveillance program. It is not intended to diagnose MRSA infection nor to guide or monitor treatment for MRSA infections.      Studies: Dg Chest 2 View  Result Date: 09/24/2016 CLINICAL DATA:  Fall down stairs today.  Initial encounter. EXAM: CHEST  2 VIEW COMPARISON:  09/17/2016 and prior exam FINDINGS: The cardiomediastinal silhouette is unremarkable. Elevation of the right hemidiaphragm and right basilar scarring again noted. Surgical clips overlying the right axilla noted. There is no evidence of focal airspace disease, pulmonary edema, suspicious pulmonary nodule/mass, pleural effusion, or pneumothorax. No acute bony abnormalities are identified. IMPRESSION: No active cardiopulmonary disease. Electronically Signed   By: Harmon Pier M.D.   On: 09/24/2016 14:09   Dg Tibia/fibula Left  Result Date: 09/24/2016 CLINICAL DATA:  Acute left lower leg pain following fall today. Initial encounter. EXAM: LEFT TIBIA AND FIBULA - 2 VIEW COMPARISON:  None FINDINGS: Lateral soft tissue swelling noted. There is no  evidence of acute fracture, subluxation or dislocation. No focal bony lesions are present. Vascular calcifications are  noted. IMPRESSION: Soft tissue swelling without acute bony abnormality. Electronically Signed   By: Harmon Pier M.D.   On: 09/24/2016 14:07   Ct Head Wo Contrast  Result Date: 09/24/2016 CLINICAL DATA:  Patient fell down some steps today, has abrasion to mid upper frontal region, neck pains when moving neck, hx of PVD, HTN, rheumatoid arthritis, no other complaints EXAM: CT HEAD WITHOUT CONTRAST CT CERVICAL SPINE WITHOUT CONTRAST TECHNIQUE: Multidetector CT imaging of the head and cervical spine was performed following the standard protocol without intravenous contrast. Multiplanar CT image reconstructions of the cervical spine were also generated. COMPARISON:  09/17/2016 FINDINGS: CT HEAD FINDINGS Brain: No evidence of acute infarction, hemorrhage, extra-axial collection, ventriculomegaly, or mass effect. Generalized cerebral atrophy. Periventricular white matter low attenuation likely secondary to microangiopathy. Vascular: Cerebrovascular atherosclerotic calcifications are noted. Skull: Negative for fracture or focal lesion. Sinuses/Orbits: Visualized portions of the orbits are unremarkable. Visualized portions of the paranasal sinuses and mastoid air cells are unremarkable. Other: Left frontal scalp soft tissue swelling. CT CERVICAL SPINE FINDINGS Alignment: 3 mm anterolisthesis of C2 on C3 secondary to facet disease. 3 mm anterolisthesis of C3 on C4 secondary to facet disease. Skull base and vertebrae: No acute fracture. No primary bone lesion or focal pathologic process. Soft tissues and spinal canal: No prevertebral fluid or swelling. No visible canal hematoma. Disc levels: Osseous fusion across the C4-5 disc space. Severe degenerative disc disease with disc height loss at C5-6 and C6-7. Severe right and mild left facet arthropathy at C2-3. Severe left facet arthropathy with left foraminal stenosis at C3-4. Broad-based disc osteophyte complex at C4-5 with bilateral uncovertebral degenerative changes and  right facet arthropathy resulting in bilateral foraminal stenosis. At C5-6 there is a broad-based disc osteophyte complex with bilateral uncovertebral degenerative changes and bilateral foraminal stenosis. At C6-7 there is a broad-based disc osteophyte complex with bilateral uncovertebral degenerative changes with bilateral foraminal stenosis. Upper chest: Lung apices are clear. Other: No fluid collection or hematoma. Bilateral carotid artery atherosclerosis. IMPRESSION: 1. No acute intracranial pathology. 2. No acute osseous injury the cervical spine. 3. 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 Electronically Signed   By: Elige Ko   On: 09/24/2016 13:47   Ct Angio Chest Pe W And/or Wo Contrast  Result Date: 09/24/2016 CLINICAL DATA:  Acute onset of intermittent shortness of breath. Initial encounter. EXAM: CT ANGIOGRAPHY CHEST WITH CONTRAST TECHNIQUE: Multidetector CT imaging of the chest was performed using the standard protocol during bolus administration of intravenous contrast. Multiplanar CT image reconstructions and MIPs were obtained to evaluate the vascular anatomy. CONTRAST:  100 mL of Isovue 370 IV contrast COMPARISON:  Chest radiograph performed earlier today at 1:30 p.m. FINDINGS: Cardiovascular:  There is no evidence of pulmonary embolus. The heart is normal in size. Scattered calcification is noted along the thoracic aorta and proximal great vessels. Mediastinum/Nodes: The mediastinum is unremarkable appearance. No mediastinal lymphadenopathy is seen. No pericardial effusion is identified. The thyroid gland is grossly unremarkable. No axillary lymphadenopathy is appreciated. Lungs/Pleura: A trace right pleural effusion is noted, with associated atelectasis. No pneumothorax is seen. No masses are identified. Upper Abdomen: The visualized portions of the liver and spleen are grossly unremarkable. A patent vascular stent is noted at the origin of the celiac trunk. Musculoskeletal:  No acute osseous abnormalities are identified. Right convex thoracic scoliosis is noted. There is chronic deformity of the body  of the sternum. The visualized musculature is unremarkable in appearance. Review of the MIP images confirms the above findings. IMPRESSION: 1. No evidence of pulmonary embolus. 2. Trace right pleural effusion, with associated atelectasis. 3. Scattered aortic atherosclerosis. 4. Right convex thoracic scoliosis noted. Electronically Signed   By: Roanna Raider M.D.   On: 09/24/2016 18:41   Ct Cervical Spine Wo Contrast  Result Date: 09/24/2016 CLINICAL DATA:  Patient fell down some steps today, has abrasion to mid upper frontal region, neck pains when moving neck, hx of PVD, HTN, rheumatoid arthritis, no other complaints EXAM: CT HEAD WITHOUT CONTRAST CT CERVICAL SPINE WITHOUT CONTRAST TECHNIQUE: Multidetector CT imaging of the head and cervical spine was performed following the standard protocol without intravenous contrast. Multiplanar CT image reconstructions of the cervical spine were also generated. COMPARISON:  09/17/2016 FINDINGS: CT HEAD FINDINGS Brain: No evidence of acute infarction, hemorrhage, extra-axial collection, ventriculomegaly, or mass effect. Generalized cerebral atrophy. Periventricular white matter low attenuation likely secondary to microangiopathy. Vascular: Cerebrovascular atherosclerotic calcifications are noted. Skull: Negative for fracture or focal lesion. Sinuses/Orbits: Visualized portions of the orbits are unremarkable. Visualized portions of the paranasal sinuses and mastoid air cells are unremarkable. Other: Left frontal scalp soft tissue swelling. CT CERVICAL SPINE FINDINGS Alignment: 3 mm anterolisthesis of C2 on C3 secondary to facet disease. 3 mm anterolisthesis of C3 on C4 secondary to facet disease. Skull base and vertebrae: No acute fracture. No primary bone lesion or focal pathologic process. Soft tissues and spinal canal: No prevertebral fluid or  swelling. No visible canal hematoma. Disc levels: Osseous fusion across the C4-5 disc space. Severe degenerative disc disease with disc height loss at C5-6 and C6-7. Severe right and mild left facet arthropathy at C2-3. Severe left facet arthropathy with left foraminal stenosis at C3-4. Broad-based disc osteophyte complex at C4-5 with bilateral uncovertebral degenerative changes and right facet arthropathy resulting in bilateral foraminal stenosis. At C5-6 there is a broad-based disc osteophyte complex with bilateral uncovertebral degenerative changes and bilateral foraminal stenosis. At C6-7 there is a broad-based disc osteophyte complex with bilateral uncovertebral degenerative changes with bilateral foraminal stenosis. Upper chest: Lung apices are clear. Other: No fluid collection or hematoma. Bilateral carotid artery atherosclerosis. IMPRESSION: 1. No acute intracranial pathology. 2. No acute osseous injury the cervical spine. 3. 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 Electronically Signed   By: Elige Ko   On: 09/24/2016 13:47   Dg Foot Complete Left  Result Date: 09/24/2016 CLINICAL DATA:  Anterior left foot pain after a fall down steps this morning. EXAM: LEFT FOOT - COMPLETE 3+ VIEW COMPARISON:  None. FINDINGS: Diffuse bone demineralization. Deformity of the distal left fifth metatarsal bone may be due to postoperative change or bone resorption from erosive arthritic process. Soft tissue swelling over the dorsum of the foot. Diffuse vascular calcifications. No evidence of acute fracture or dislocation. No focal bone lesion. IMPRESSION: Diffuse bone demineralization. No acute fractures identified. Old resection or resorption of the distal fifth metatarsal bone. Electronically Signed   By: Burman Nieves M.D.   On: 09/24/2016 23:35    Scheduled Meds: . amitriptyline  20 mg Oral QHS  . atorvastatin  20 mg Oral QPM  . bifidobacterium infantis  1 capsule Oral Daily  . cefUROXime   500 mg Oral BID WC  . cholecalciferol  1,000 Units Oral Daily  . cyclobenzaprine  10 mg Oral Daily  . metoprolol tartrate  25 mg Oral BID  . multivitamin with minerals  1  tablet Oral Daily  . predniSONE  5 mg Oral Daily  . sodium chloride flush  3 mL Intravenous Q12H  . timolol  1 drop Both Eyes Daily   Continuous Infusions: . sodium chloride 75 mL/hr at 09/26/16 0600    Principal Problem:   SIRS (systemic inflammatory response syndrome) (HCC) Active Problems:   Hypertension   RA (rheumatoid arthritis) (HCC)   PVD (peripheral vascular disease) (HCC)   Normocytic anemia   Hypokalemia   Fall   Protein-calorie malnutrition, moderate (HCC)   Chronic diastolic CHF (congestive heart failure) (HCC)    Time spent: 25 minutes    Vassie Loll  [facility] Pager [phone]. If 7PM-7AM, please contact night-coverage at [email or web address], password [facility] 09/26/2016, 9:40 AM  LOS: 2 days

## 2016-09-26 NOTE — Progress Notes (Signed)
Pt has had two runs of SVT, rate up to 150 bpm, once at 2025 and again now. RN notified Merdis Delay, NP at 0205 of runs of SVT. RN also informed NP or potassium level at 0321 on 4/19 and of potassium replacement that was ordered yesterday. Will continue to monitor.

## 2016-09-26 NOTE — Progress Notes (Signed)
Patient transferred to unit from ICU.  Skin assessment performed.  Kerlix changed as ordered by wound RN.  Patient refuses to allow RN to change ABD pads or remove allevyn from sacrum to assess patient's wound.  Reported by Morrie Sheldon, ICU RN, patient's wound is a stage two.

## 2016-09-27 LAB — GLUCOSE, CAPILLARY
GLUCOSE-CAPILLARY: 62 mg/dL — AB (ref 65–99)
GLUCOSE-CAPILLARY: 103 mg/dL — AB (ref 65–99)

## 2016-09-27 NOTE — Progress Notes (Signed)
Hypoglycemic Event  CBG: 62  Treatment: 15 GM carbohydrate snack  Symptoms: None  Follow-up CBG: Time:0905 CBG Result:103  Possible Reasons for Event: Inadequate meal intake  Comments/MD notified:    Deneise Lever

## 2016-09-27 NOTE — Progress Notes (Signed)
[facility] PROGRESS NOTE  Allison Mendez PQZ:[id number] DOB: [birth year] DOA: 09/24/2016 PCP: Pcp Not In System  Interim summary and history of present illness 76 year old female with medical history significant of right AKA, hypertension, hyperlipidemia, depression, rheumatoid arthritis, diastolic congestive heart failure and recent admission due to CAP, Escherichia coli UTI and Escherichia coli bacteremia. Who was at home recovering from these recent infection doing okay and without acute complaints; and experienced a mechanical fall down her sisters stairs while she was trying to transition herself off her wheelchair and into different level in the house. Patient denies any chest pain, shortness of breath, loss of consciousness or any other prodromic syndrome. In the ED she was found to be hypoxic, with elevated lactic acid, significant elevation of her WBCs, and with multiple skin lacerations, abrasions and bruises. Patient was also tachycardic and meeting SIRS criteria.  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 -Patient blood culture has remained without any growth, UA without signs of acute infection and no infiltrates or acute abnormalities on her chest x-ray seen on admission. -Patient wbc's has continue improving and pro-calcitonin is only 0.4  -No frank source of infection appreciated at this moment and patient has remained afebrile. -Will narrow antibiotic to ceftin PO and treat for 3 more days; to complete treatment as indicated for recent E. Coli UTI and bacteremia. Last day of antibiotic will be 4/22  -Will change IVF's to [facility] -continue supportive care  -repeated lactic acid is normal now   2-Mechanical fall -Will ask physical therapy to evaluate for recommendations and assessment of patient capacity for safe discharge. -patient is declining any facility for rehab at this  moment during my evaluation.   3-acute respiratory failure with hypoxia -No acute abnormalities seen on chest x-ray -Patient has improved significantly with oxygen supplementation and currently using only 1 L of oxygen with 96-100 sat (at rest). Even she appears tachypneic and unable to speak in full sentences. -will continue weaning oxygen off as toelrated -questionable if initial measurement was accurate or not -Patient did not use oxygen at baseline.  4-normocytic anemia -Patient with a decrease of her hemoglobin from recent blood work -Probably associated with blood loss with skin tear -Patient denies hematuria, hematochezia, melena, hematemesis or any other source/signs of bleeding prior to admission. No bleeding , other than oozing from wound has been seen here either.  -Will follow hemoglobin trend  5-hypertension and tachycardia  -Will continue metoprolol -dose adjusted on 4/20 for better control; will monitor on telemetry   6-chronic diastolic heart failure -Compensated and stable currently  -No signs of fluid overload on exam or vascular congestion appreciated on chest x-ray -Will follow daily weights and strict intake and output -no crackles on auscultation   7-rheumatoid arthritis -Patient received Solu-Cortef 50 mg 1 as a stress dose on admission  -Cortisol level within normal limits -will continue Home prednisone regimen   8-hypokalemia -will replete as needed an continue electrolyte monitoring  -will check BMET in am  9-moderate protein calorie malnutrition -will follow nutritional service rec's for feeding supplements -patient encourage to follow good hydration and PO intake   10-Numerous and severe skin tears to right arm, left thigh, left knee laceration, skin tears to the left anterior LE, bilateral ankles. -no superimposed infections seen -will follow wound care recommendations   Code Status: DO NOT RESUSCITATE Family Communication: sister at bedside   Disposition Plan: to be determined. Per PT,  patient will benefit of ANF, which is essentially declined by patient. After discussing with patient and her sister, they will like to involve palliative care into the discussion. Potentially home with hospice vs SNF with palliative care.   Consultants:   None  Procedures:   see Below for x-ray reports  Antibiotics:  Vancomycin and Zosyn 09/24/16>>09/26/16  Ceftin 4/20>>4/22  HPI/Subjective: Patient denies CP, nausea, vomiting and any acute distress. She looks in mild resp distress and is using 1L of oxygen supplementation. Chronically ill, frail and underweight.   Objective: Vitals:   09/27/16 1510 09/27/16 1511  BP:    Pulse:    Resp: (!) 28 (!) 22  Temp:      Intake/Output Summary (Last 24 hours) at 09/27/16 1848 Last data filed at 09/27/16 1500  Gross per 24 hour  Intake              670 ml  Output                0 ml  Net              670 ml   Filed Weights   09/25/16 0520 09/26/16 1214 09/27/16 0431  Weight: 42.7 kg (94 lb 2.2 oz) 44.2 kg (97 lb 7.1 oz) 43.8 kg (96 lb 9 oz)    Exam:   General: patient with depressed mood; in no acute distress and denying CP. tachypneic and unable to speak in full sentences (even she expressed that is close to her baseline). Patient denies nausea, and vomiting; but endorses poor appetite. Using 1L of O2 supplementation   Cardiovascular: HR in the 100-120's range intermittently. No rubs, no gallops. Positive SEM  Respiratory: no wheezing, no crackles, positive rhonchi.    Abdomen: soft, NT, ND, positive BS  Musculoskeletal: multiple open skin tears and bruises appreciated on her extremities and face. +1+ pedal edema on her left lower extremity and positive right AKA. No joint swelling appreciated on her joints, multiple deformities affecting her foot and hands bilaterally (from RA). There is no signs of superimposed infections in her wounds. Serosanguineous oozing appreciated as part  of drainage (especially from her LLE).   Data Reviewed: Basic Metabolic Panel:  Recent Labs Lab 09/21/16 0447 09/24/16 1530 09/25/16 0321 09/26/16 0215  NA 142 141 136 140  K 3.1* 3.2* 2.9* 4.8  CL 104 100* 96* 108  CO2 32 34* 25 27  GLUCOSE 110* 109* 164* 107*  BUN 10 11 48* 8  CREATININE 0.59 0.54 0.72 0.37*  CALCIUM 8.1* 8.5* 7.4* 7.6*  MG  --   --  1.2* 1.6*   Liver Function Tests:  Recent Labs Lab 09/21/16 0447 09/24/16 2256 09/25/16 0321  AST 30 123* 39  ALT 210* 121* 36  ALKPHOS 100 148* 57  BILITOT 0.9 2.9* 1.4*  PROT 5.3* 5.0* 6.1*  ALBUMIN 2.4* 2.6* 3.7   CBC:  Recent Labs Lab 09/21/16 0447 09/24/16 1530 09/24/16 1711 09/24/16 2256 09/26/16 0323  WBC 7.9 29.1* 20.3* 14.3* 13.7*  NEUTROABS  --  27.0*  --   --   --   HGB 12.3 12.0 9.5* 9.3* 8.1*  HCT 37.6 36.7 29.4* 28.3* 25.0*  MCV 95.9 97.6 98.0 96.3 99.2  PLT 160 266 188 197 231   Cardiac Enzymes:  Recent Labs Lab 09/21/16 0447  TROPONINI 0.03*   BNP (last 3 results)  Recent Labs  09/24/16 2256  BNP 96.0    CBG:  Recent Labs Lab 09/25/16 0753 09/25/16  2112 09/26/16 0753 09/27/16 0819 09/27/16 0906  GLUCAP 114* 122* 84 62* 103*    Recent Results (from the past 240 hour(s))  Blood culture (routine x 2)     Status: None   Collection Time: 09/17/16  9:49 PM  Result Value Ref Range Status   Specimen Description BLOOD LEFT HAND  Final   Special Requests   Final    BOTTLES DRAWN AEROBIC AND ANAEROBIC Blood Culture results may not be optimal due to an inadequate volume of blood received in culture bottles   Culture   Final    NO GROWTH 5 DAYS Performed at [facility], [address] [place], [place] [address]    Report Status 09/23/2016 FINAL  Final  Urine culture     Status: None   Collection Time: 09/17/16 10:16 PM  Result Value Ref Range Status   Specimen Description URINE, CATHETERIZED  Final   Special Requests NONE  Final   Culture   Final    NO  GROWTH Performed at [facility], [address] [place], [place] [address]    Report Status 09/19/2016 FINAL  Final  Blood culture (routine x 2)     Status: Abnormal   Collection Time: 09/17/16 10:55 PM  Result Value Ref Range Status   Specimen Description BLOOD LEFT WRIST  Final   Special Requests BOTTLES DRAWN AEROBIC AND ANAEROBIC BCAV  Final   Culture  Setup Time   Final    GRAM NEGATIVE RODS AEROBIC BOTTLE ONLY CRITICAL RESULT CALLED TO, READ BACK BY AND VERIFIED WITH: A JOHNSTON PHARMD 2050 09/18/16 A BROWNING Performed at [facility], [address], [place], [place] [address]    Culture ESCHERICHIA COLI (A)  Final   Report Status 09/21/2016 FINAL  Final   Organism ID, Bacteria ESCHERICHIA COLI  Final      Susceptibility   Escherichia coli - MIC*    AMPICILLIN 4 SENSITIVE Sensitive     CEFAZOLIN <=4 SENSITIVE Sensitive     CEFEPIME <=1 SENSITIVE Sensitive     CEFTAZIDIME <=1 SENSITIVE Sensitive     CEFTRIAXONE <=1 SENSITIVE Sensitive     CIPROFLOXACIN <=0.25 SENSITIVE Sensitive     GENTAMICIN <=1 SENSITIVE Sensitive     IMIPENEM <=0.25 SENSITIVE Sensitive     TRIMETH/SULFA <=20 SENSITIVE Sensitive     AMPICILLIN/SULBACTAM <=2 SENSITIVE Sensitive     PIP/TAZO <=4 SENSITIVE Sensitive     Extended ESBL NEGATIVE Sensitive     * ESCHERICHIA COLI  Blood Culture ID Panel (Reflexed)     Status: Abnormal   Collection Time: 09/17/16 10:55 PM  Result Value Ref Range Status   Enterococcus species NOT DETECTED NOT DETECTED Final   Listeria monocytogenes NOT DETECTED NOT DETECTED Final   Staphylococcus species NOT DETECTED NOT DETECTED Final   Staphylococcus aureus NOT DETECTED NOT DETECTED Final   Streptococcus species NOT DETECTED NOT DETECTED Final   Streptococcus agalactiae NOT DETECTED NOT DETECTED Final   Streptococcus pneumoniae NOT DETECTED NOT DETECTED Final   Streptococcus pyogenes NOT DETECTED NOT DETECTED Final   Acinetobacter baumannii NOT DETECTED NOT  DETECTED Final   Enterobacteriaceae species DETECTED (A) NOT DETECTED Final    Comment: Enterobacteriaceae represent a large family of gram-negative bacteria, not a single organism. CRITICAL RESULT CALLED TO, READ BACK BY AND VERIFIED WITH: A JOHNSTON PHARMD 2050 09/18/16 A BROWNING    Enterobacter cloacae complex NOT DETECTED NOT DETECTED Final   Escherichia coli DETECTED (A) NOT DETECTED  Final    Comment: CRITICAL RESULT CALLED TO, READ BACK BY AND VERIFIED WITH: A JOHNSTON PHARMD 2050 09/18/16 A BROWNING    Klebsiella oxytoca NOT DETECTED NOT DETECTED Final   Klebsiella pneumoniae NOT DETECTED NOT DETECTED Final   Proteus species NOT DETECTED NOT DETECTED Final   Serratia marcescens NOT DETECTED NOT DETECTED Final   Carbapenem resistance NOT DETECTED NOT DETECTED Final   Haemophilus influenzae NOT DETECTED NOT DETECTED Final   Neisseria meningitidis NOT DETECTED NOT DETECTED Final   Pseudomonas aeruginosa NOT DETECTED NOT DETECTED Final   Candida albicans NOT DETECTED NOT DETECTED Final   Candida glabrata NOT DETECTED NOT DETECTED Final   Candida krusei NOT DETECTED NOT DETECTED Final   Candida parapsilosis NOT DETECTED NOT DETECTED Final   Candida tropicalis NOT DETECTED NOT DETECTED Final    Comment: Performed at [facility], [address] [place], [place] [address]  MRSA PCR Screening     Status: None   Collection Time: 09/18/16  3:08 AM  Result Value Ref Range Status   MRSA by PCR NEGATIVE NEGATIVE Final    Comment:        The GeneXpert MRSA Assay (FDA approved for NASAL specimens only), is one component of a comprehensive MRSA colonization surveillance program. It is not intended to diagnose MRSA infection nor to guide or monitor treatment for MRSA infections.   Blood Culture (routine x 2)     Status: None (Preliminary result)   Collection Time: 09/24/16  4:23 PM  Result Value Ref Range Status   Specimen Description BLOOD LEFT ANTECUBITAL  Final   Special  Requests   Final    BOTTLES DRAWN AEROBIC AND ANAEROBIC Blood Culture adequate volume   Culture   Final    NO GROWTH 3 DAYS Performed at [facility], [address], [place], [place] [address]    Report Status PENDING  Incomplete  Blood Culture (routine x 2)     Status: None (Preliminary result)   Collection Time: 09/24/16  4:23 PM  Result Value Ref Range Status   Specimen Description BLOOD BLOOD LEFT HAND  Final   Special Requests IN PEDIATRIC BOTTLE Blood Culture adequate volume  Final   Culture   Final    NO GROWTH 3 DAYS Performed at [facility], [address], [place], [place] [address]    Report Status PENDING  Incomplete  MRSA PCR Screening     Status: None   Collection Time: 09/24/16  9:11 PM  Result Value Ref Range Status   MRSA by PCR NEGATIVE NEGATIVE Final    Comment:        The GeneXpert MRSA Assay (FDA approved for NASAL specimens only), is one component of a comprehensive MRSA colonization surveillance program. It is not intended to diagnose MRSA infection nor to guide or monitor treatment for MRSA infections.      Studies: No results found.  Scheduled Meds: . amitriptyline  20 mg Oral QHS  . atorvastatin  20 mg Oral QPM  . bifidobacterium infantis  1 capsule Oral Daily  . cefUROXime  500 mg Oral BID WC  . cholecalciferol  1,000 Units Oral Daily  . cyclobenzaprine  10 mg Oral Daily  . metoprolol tartrate  25 mg Oral BID  . multivitamin with minerals  1 tablet Oral Daily  . predniSONE  5 mg Oral Daily  . sodium chloride flush  3 mL Intravenous Q12H  . timolol  1 drop Both Eyes  Daily   Continuous Infusions: . sodium chloride 10 mL/hr at 09/27/16 1310    Principal Problem:   SIRS (systemic inflammatory response syndrome) (HCC) Active Problems:   Hypertension   RA (rheumatoid arthritis) (HCC)   PVD (peripheral vascular disease) (HCC)   Normocytic anemia   Hypokalemia   Fall   Protein-calorie malnutrition, moderate (HCC)    Chronic diastolic CHF (congestive heart failure) (HCC)    Time spent: 25 minutes    Vassie Loll  [facility] Pager [phone]. If 7PM-7AM, please contact night-coverage at [email or web address], password [facility] 09/27/2016, 6:48 PM  LOS: 3 days

## 2016-09-27 NOTE — Evaluation (Signed)
Physical Therapy Evaluation Patient Details Name: Allison Mendez MRN: [id number] DOB: 06/19/1940 Today's Date: 09/27/2016   History of Present Illness  Allison Mendez is a 76 y.o. female with medical history significant of s/p of R AKA, hypertension, hyperlipidemia, depression, rheumatoid arthritis, PVD, rheumatoid lung, dCHF, who presents with fall down stairs. recently in hospital with AMS and fall.  Clinical Impression  The patient is requiring total assistance for transfers from bed. The patient was difficult to understand her speech at times. The patient's sister was present. Pt admitted with above diagnosis. Pt currently with functional limitations due to the deficits listed below (see PT Problem List).  Pt will benefit from skilled PT to increase their independence and safety with mobility to allow discharge to the venue listed below.       Follow Up Recommendations SNF;Supervision/Assistance - 24 hour    Equipment Recommendations  None recommended by PT    Recommendations for Other Services       Precautions / Restrictions Precautions Precautions: Fall Precaution Comments: multiple skin tears      Mobility  Bed Mobility Overal bed mobility: Needs Assistance Bed Mobility: Supine to Sit;Sit to Supine     Supine to sit: Max assist Sit to supine: Max assist   General bed mobility comments: assist with leg and trunk, assist to scoot to edge, assist with trunk back into bed  Transfers Overall transfer level: Needs assistance   Transfers: Sit to/from Stand;Squat Pivot Transfers Sit to Stand: Total assist   Squat pivot transfers: Total assist     General transfer comment: much assist to stand from bed and BSC and pivot between surfaces.Difficult due to skin tears and sores on the legs and arms.  Ambulation/Gait                Stairs            Wheelchair Mobility    Modified Rankin (Stroke Patients Only)       Balance Overall balance assessment: Needs  assistance Sitting-balance support: Bilateral upper extremity supported Sitting balance-Leahy Scale: Poor Sitting balance - Comments: listing to the right                                     Pertinent Vitals/Pain Pain Assessment: Faces Faces Pain Scale: Hurts whole lot Pain Location: almost anywhere she is touched Pain Descriptors / Indicators: Discomfort;Grimacing;Guarding Pain Intervention(s): Monitored during session;Repositioned;Limited activity within patient's tolerance    Home Living Family/patient expects to be discharged to:: Private residence Living Arrangements: Other relatives Available Help at Discharge: Family;Available 24 hours/day Type of Home: House Home Access: Level entry     Home Layout: Two level;Bed/bath upstairs Home Equipment: Wheelchair - manual;Shower seat Additional Comments: Patient lives in [place]. Visiting sister     Prior Function     Gait / Transfers Assistance Needed: Able to transfer into/out of w/c on her own.  Bumps up/down stairs on bottom.  Requires assist to move w/c up/down stairs.from previous admission 1 week ago.            Hand Dominance        Extremity/Trunk Assessment   Upper Extremity Assessment Upper Extremity Assessment: Generalized weakness RUE Deficits / Details: Significant RA changes to hands.  Decreased shoulder ROM. RUE Coordination: decreased fine motor LUE Deficits / Details: Significant RA changes to hands.  Decreased shoulder ROM. LUE Coordination: decreased fine motor  Lower Extremity Assessment Lower Extremity Assessment: RLE deficits/detail RLE Deficits / Details: Patient s/p Rt AKA LLE Deficits / Details: Strength grossly 3+/5.  Patient with wound on ankle/heel and to thigh with bandages covering. LLE Coordination: decreased gross motor    Cervical / Trunk Assessment Cervical / Trunk Assessment: Kyphotic  Communication      Cognition Arousal/Alertness: Lethargic Behavior During  Therapy: Restless Overall Cognitive Status: Difficult to assess                                 General Comments: the patient's speech is difficullt to understand at times.      General Comments      Exercises     Assessment/Plan    PT Assessment Patient needs continued PT services  PT Problem List Decreased strength;Decreased range of motion;Decreased activity tolerance;Decreased balance;Decreased mobility;Pain;Decreased safety awareness       PT Treatment Interventions Functional mobility training;Therapeutic activities;Therapeutic exercise;Patient/family education;Wheelchair mobility training    PT Goals (Current goals can be found in the Care Plan section)       Frequency Min 3X/week   Barriers to discharge Decreased caregiver support;Inaccessible home environment      Co-evaluation               End of Session Equipment Utilized During Treatment: Oxygen Activity Tolerance: Patient limited by fatigue;Patient limited by pain Patient left: in bed;with call bell/phone within reach;with bed alarm set;with family/visitor present Nurse Communication: Mobility status PT Visit Diagnosis: Other abnormalities of gait and mobility (R26.89);History of falling (Z91.81);Muscle weakness (generalized) (M62.81)    Time: 7564-3329 PT Time Calculation (min) (ACUTE ONLY): 27 min   Charges:   PT Evaluation $PT Eval Moderate Complexity: 1 Procedure PT Treatments $Therapeutic Activity: 8-22 mins   PT G CodesBlanchard Kelch PT [phone] }  Rada Hay 09/27/2016, 1:47 PM

## 2016-09-28 DIAGNOSIS — R6510 Systemic inflammatory response syndrome (SIRS) of non-infectious origin without acute organ dysfunction: Secondary | ICD-10-CM

## 2016-09-28 MED ORDER — METOPROLOL TARTRATE 5 MG/5ML IV SOLN
2.5000 mg | Freq: Once | INTRAVENOUS | Status: AC
  Administered 2016-09-28: 2.5 mg via INTRAVENOUS
  Filled 2016-09-28: qty 5

## 2016-09-28 MED ORDER — MAGNESIUM SULFATE 2 GM/50ML IV SOLN
2.0000 g | Freq: Once | INTRAVENOUS | Status: AC
Start: 2016-09-28 — End: 2016-09-28
  Administered 2016-09-28: 2 g via INTRAVENOUS
  Filled 2016-09-28: qty 50

## 2016-09-28 NOTE — Progress Notes (Signed)
[facility] PROGRESS NOTE  Allison Mendez HWT:[id number] DOB: [birth year] DOA: 09/24/2016 PCP: Pcp Not In System  Interim summary and history of present illness 76 year old female with medical history significant of right AKA, hypertension, hyperlipidemia, depression, rheumatoid arthritis, diastolic congestive heart failure and recent admission due to CAP, Escherichia coli UTI and Escherichia coli bacteremia. Who was at home recovering from these recent infection doing okay and without acute complaints; and experienced a mechanical fall down her sisters stairs while she was trying to transition herself off her wheelchair and into different level in the house. Patient denies any chest pain, shortness of breath, loss of consciousness or any other prodromic syndrome. In the ED she was found to be hypoxic, with elevated lactic acid, significant elevation of her WBCs, and with multiple skin lacerations, abrasions and bruises. Patient was also tachycardic and meeting SIRS criteria.  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 -Patient blood culture has remained without any growth, UA without signs of acute infection and no infiltrates or acute abnormalities on her chest x-ray seen on admission. -Patient wbc's has continue improving and pro-calcitonin is only 0.4  -No frank source of infection appreciated at this moment and patient has remained afebrile. -Will narrow antibiotic to ceftin PO and treat for 3 more days; to complete treatment as indicated for recent E. Coli UTI and bacteremia. Last day of antibiotic will be 4/22  -Will change IVF's to [facility] -continue supportive care  -last lactic acid was normal -palliative care has discussed with patient and family and decision has been to pursuit residential hospice and  Comfort care.  2-Mechanical fall -physical therapy recommended SNF. Patient  significantly weak and requiring 2++ assistance. -patient open to residential hospice -fear that she doesn't have much stamina for rehab at this point.   3-acute respiratory failure with hypoxia -No acute abnormalities seen on chest x-ray -Patient has improved significantly with oxygen supplementation and currently using only 1 L of oxygen with 96-100 sat (at rest). Even she appears tachypneic and unable to speak in full sentences. -will continue weaning oxygen off as toelrated -questionable if initial measurement was accurate or not -Patient did not use oxygen at baseline. -for now will focus mainly on comfort   4-normocytic anemia -Patient with a decrease of her hemoglobin from recent blood work -Probably associated with blood loss with skin tear -Patient denies hematuria, hematochezia, melena, hematemesis or any other source/signs of bleeding prior to admission. No bleeding , other than oozing from wound has been seen here either.  -no more blood draws. Plan is for comfort care  5-hypertension and tachycardia  -Will continue metoprolol -will d/c telemetry   6-chronic diastolic heart failure -Compensated and stable currently  -No signs of fluid overload on exam or vascular congestion appreciated on chest x-ray -Will follow daily weights and strict intake and output -no crackles on auscultation   7-rheumatoid arthritis -Patient received Solu-Cortef 50 mg 1 as a stress dose on admission  -Cortisol level within normal limits -will continue Home prednisone regimen   8-hypokalemia -last potassium WNL -no further blood draws -will monitor on comfort care  9-moderate protein calorie malnutrition -will focus on comfort feeding   10-Numerous and severe skin tears to right arm, left thigh, left knee laceration, skin tears to the left anterior LE, bilateral ankles. -no superimposed infections seen -will follow wound care recommendations   Code Status: DO NOT RESUSCITATE Family  Communication: sister at bedside  Disposition Plan: following discussion from palliative care and GOC meeting; will pursuit comfort care and residential hospice.   Consultants:   None  Procedures:   see Below for x-ray reports  Antibiotics:  Vancomycin and Zosyn 09/24/16>>09/26/16  Ceftin 4/20>>4/22  HPI/Subjective: Patient denies CP and SOB. Even that she appears to be tachypneic and in mild resp distress. Continue physically declining and barely eating anything.   Objective: Vitals:   09/28/16 0454 09/28/16 1651  BP: 129/65 123/64  Pulse: (!) 124 99  Resp: 20 18  Temp: 97.9 F (36.6 C) 98.1 F (36.7 C)    Intake/Output Summary (Last 24 hours) at 09/28/16 1800 Last data filed at 09/28/16 1516  Gross per 24 hour  Intake           482.67 ml  Output              250 ml  Net           232.67 ml   Filed Weights   09/26/16 1214 09/27/16 0431 09/28/16 0454  Weight: 44.2 kg (97 lb 7.1 oz) 43.8 kg (96 lb 9 oz) 42.6 kg (93 lb 14.7 oz)    Exam:   General: patient with depressed mood and very weak on assessment. barely eating.  Using 1L oxygen supplementation   Cardiovascular: HR in the 100-120's range intermittently. No rubs, no gallops. Positive SEM  Respiratory: no wheezing, no crackles, positive rhonchi diffusely.    Abdomen: soft, NT, ND, positive BS  Musculoskeletal: multiple open skin tears and bruises appreciated on her extremities and face. +1+ pedal edema on her left lower extremity and positive right AKA. No joint swelling appreciated on her joints, multiple deformities affecting her foot and hands bilaterally (from RA). There is no signs of superimposed infections in her wounds. Serosanguineous oozing appreciated as part of drainage (especially from her LLE).   Data Reviewed: Basic Metabolic Panel:  Recent Labs Lab 09/24/16 1530 09/25/16 0321 09/26/16 0215  NA 141 136 140  K 3.2* 2.9* 4.8  CL 100* 96* 108  CO2 34* 25 27  GLUCOSE 109* 164* 107*  BUN  11 48* 8  CREATININE 0.54 0.72 0.37*  CALCIUM 8.5* 7.4* 7.6*  MG  --  1.2* 1.6*   Liver Function Tests:  Recent Labs Lab 09/24/16 2256 09/25/16 0321  AST 123* 39  ALT 121* 36  ALKPHOS 148* 57  BILITOT 2.9* 1.4*  PROT 5.0* 6.1*  ALBUMIN 2.6* 3.7   CBC:  Recent Labs Lab 09/24/16 1530 09/24/16 1711 09/24/16 2256 09/26/16 0323  WBC 29.1* 20.3* 14.3* 13.7*  NEUTROABS 27.0*  --   --   --   HGB 12.0 9.5* 9.3* 8.1*  HCT 36.7 29.4* 28.3* 25.0*  MCV 97.6 98.0 96.3 99.2  PLT 266 188 197 231    BNP (last 3 results)  Recent Labs  09/24/16 2256  BNP 96.0    CBG:  Recent Labs Lab 09/25/16 0753 09/25/16 2112 09/26/16 0753 09/27/16 0819 09/27/16 0906  GLUCAP 114* 122* 84 62* 103*    Recent Results (from the past 240 hour(s))  Blood Culture (routine x 2)     Status: None (Preliminary result)   Collection Time: 09/24/16  4:23 PM  Result Value Ref Range Status   Specimen Description BLOOD LEFT ANTECUBITAL  Final   Special Requests   Final    BOTTLES DRAWN AEROBIC AND ANAEROBIC Blood Culture adequate volume   Culture   Final    NO GROWTH 4 DAYS Performed  at [facility], [address] [place], [place] [address]    Report Status PENDING  Incomplete  Blood Culture (routine x 2)     Status: None (Preliminary result)   Collection Time: 09/24/16  4:23 PM  Result Value Ref Range Status   Specimen Description BLOOD BLOOD LEFT HAND  Final   Special Requests IN PEDIATRIC BOTTLE Blood Culture adequate volume  Final   Culture   Final    NO GROWTH 4 DAYS Performed at [facility], [address] [place], [place] [address]    Report Status PENDING  Incomplete  MRSA PCR Screening     Status: None   Collection Time: 09/24/16  9:11 PM  Result Value Ref Range Status   MRSA by PCR NEGATIVE NEGATIVE Final    Comment:        The GeneXpert MRSA Assay (FDA approved for NASAL specimens only), is one component of a comprehensive MRSA colonization surveillance  program. It is not intended to diagnose MRSA infection nor to guide or monitor treatment for MRSA infections.      Studies: No results found.  Scheduled Meds: . amitriptyline  20 mg Oral QHS  . bifidobacterium infantis  1 capsule Oral Daily  . cyclobenzaprine  10 mg Oral Daily  . metoprolol tartrate  25 mg Oral BID  . multivitamin with minerals  1 tablet Oral Daily  . predniSONE  5 mg Oral Daily  . sodium chloride flush  3 mL Intravenous Q12H  . timolol  1 drop Both Eyes Daily   Continuous Infusions: . sodium chloride 10 mL/hr at 09/27/16 1310    Principal Problem:   SIRS (systemic inflammatory response syndrome) (HCC) Active Problems:   Hypertension   RA (rheumatoid arthritis) (HCC)   PVD (peripheral vascular disease) (HCC)   Normocytic anemia   Hypokalemia   Fall   Protein-calorie malnutrition, moderate (HCC)   Chronic diastolic CHF (congestive heart failure) (HCC)    Time spent: 15 minutes    Vassie Loll  [facility] Pager [phone]. If 7PM-7AM, please contact night-coverage at [email or web address], password [facility] 09/28/2016, 6:00 PM  LOS: 4 days

## 2016-09-28 NOTE — Consult Note (Signed)
Consultation Note Date: 09/28/2016   Patient Name: Allison Mendez  DOB: May 02, 1941  MRN: [id number]  Age / Sex: 76 y.o., female  PCP: Pcp Not In System Referring Physician: Vassie Loll, MD  Reason for Consultation: Establishing goals of care  HPI/Patient Profile: 76 y.o. female   admitted on 09/24/2016 .  76 year old female with medical history significant of right AKA, hypertension, hyperlipidemia, depression, rheumatoid arthritis, diastolic congestive heart failure and recent admission due to CAP, Escherichia coli UTI and Escherichia coli bacteremia.  She was at home recovering from the recent infection doing okay and without acute complaints; when she experienced a mechanical fall down her sisters stairs while she was trying to transition herself off her wheelchair and into different level in the house. Patient denies any chest pain, shortness of breath, loss of consciousness or any other prodromic syndrome. In the ED she was found to be hypoxic, with elevated lactic acid, significant elevation of her WBCs, and with multiple skin lacerations, abrasions and bruises. Patient was also tachycardic and meeting SIRS criteria.  Clinical Assessment and Goals of Care: Patient has been admitted to the hospitalist service, with concerns for sepsis, unknown etiology, she continues to have weakness, lack of appetite, not eating, has shortness of breath, has been confused more often than not.   A palliative consult has been placed for goals of care discussions.   The patient is resting in bed, she has bruises everywhere, she has not touched her breakfast tray, she is able to say her name and answer a few questions well, how ever she is not able to fully comprehend the nature of her illness, she is not able to discuss with me about her goals/wishes and appropriate disposition options.   Call placed and discussed with sister  in detail: patient is originally form [place], she has been visiting her sister for the past 3 weeks, she was noted to have been not very ambulatory, even when she was in [place]. She has been hospitalized earlier as well, sister states, " she just doesn't have it in her to fight anymore, my sister has led a hard life."  Sister is not sure she can take the patient home, even with hospice support. She does not think the patient will improve much in a skilled nursing facility environment. We discussed about hospice philosophy of care, the type of care that can be provided in a residential hospice setting, all questions answered, see recommendations below, thank you for the consult.   HCPOA  sister who lives locally is her only next of kin, patient does not have a spouse, does not have kids.   SUMMARY OF RECOMMENDATIONS   Agree with DNR DNI After discussions with patient and her next of kin, sister, recommend residential hospice.  I will request social work consult to help facilitate.  Continue pain management  Code Status/Advance Care Planning:  DNR    Symptom Management:     Continue current mode of care, see above  Palliative Prophylaxis:   Delirium Protocol  Additional  Recommendations (Limitations, Scope, Preferences):  No Artificial Feeding  Psycho-social/Spiritual:   Desire for further Chaplaincy support:no  Additional Recommendations: Education on Hospice  Prognosis:   < 2 weeks  Discharge Planning: Hospice facility      Primary Diagnoses: Present on Admission: . RA (rheumatoid arthritis) (HCC) . PVD (peripheral vascular disease) (HCC) . Hypertension . SIRS (systemic inflammatory response syndrome) (HCC) . Normocytic anemia . Hypokalemia . Fall . Protein-calorie malnutrition, moderate (HCC)   I have reviewed the medical record, interviewed the patient and family, and examined the patient. The following aspects are pertinent.  Past Medical History:    Diagnosis Date  . Hypertension   . PVD (peripheral vascular disease) (HCC)   . RA (rheumatoid arthritis) (HCC)   . Rheumatoid lung (HCC)   . S/P AKA (above knee amputation), right [facility])    Social History   Social History  . Marital status: Single    Spouse name: N/A  . Number of children: N/A  . Years of education: N/A   Social History Main Topics  . Smoking status: Never Smoker  . Smokeless tobacco: Never Used  . Alcohol use No  . Drug use: No  . Sexual activity: Not Asked   Other Topics Concern  . None   Social History Narrative  . None   Family History  Problem Relation Age of Onset  . Rheum arthritis Neg Hx    Scheduled Meds: . amitriptyline  20 mg Oral QHS  . atorvastatin  20 mg Oral QPM  . bifidobacterium infantis  1 capsule Oral Daily  . cefUROXime  500 mg Oral BID WC  . cholecalciferol  1,000 Units Oral Daily  . cyclobenzaprine  10 mg Oral Daily  . metoprolol tartrate  25 mg Oral BID  . multivitamin with minerals  1 tablet Oral Daily  . predniSONE  5 mg Oral Daily  . sodium chloride flush  3 mL Intravenous Q12H  . timolol  1 drop Both Eyes Daily   Continuous Infusions: . sodium chloride 10 mL/hr at 09/27/16 1310   PRN Meds:.acetaminophen **OR** acetaminophen, albuterol, carisoprodol, HYDROcodone-acetaminophen, ondansetron **OR** ondansetron (ZOFRAN) IV, pregabalin, zolpidem Medications Prior to Admission:  Prior to Admission medications   Medication Sig Start Date End Date Taking? Authorizing Provider  alendronate (FOSAMAX) 70 MG tablet Take 70 mg by mouth once a week. 07/29/16  Yes Historical Provider, MD  amitriptyline (ELAVIL) 10 MG tablet Take 20 mg by mouth at bedtime. 09/01/16  Yes Historical Provider, MD  atorvastatin (LIPITOR) 20 MG tablet Take 20 mg by mouth every evening. 09/01/16  Yes Historical Provider, MD  carisoprodol (SOMA) 350 MG tablet Take 350 mg by mouth at bedtime as needed (pain).  08/12/16  Yes Historical Provider, MD   cholecalciferol (VITAMIN D) 1000 units tablet Take 1,000 Units by mouth daily.   Yes Historical Provider, MD  cyclobenzaprine (FLEXERIL) 10 MG tablet Take 10 mg by mouth daily. 08/31/16  Yes Historical Provider, MD  Flaxseed, Linseed, (FLAXSEED OIL PO) Take 1 capsule by mouth daily.   Yes Historical Provider, MD  HYDROcodone-acetaminophen (NORCO) 10-325 MG tablet Take 1 tablet by mouth every 4 (four) hours as needed for moderate pain (Do not exceed 4 tablets per day).  08/27/16  Yes Historical Provider, MD  ibuprofen (ADVIL,MOTRIN) 600 MG tablet Take 600 mg by mouth 3 (three) times daily.  08/19/16  Yes Historical Provider, MD  LYRICA 75 MG capsule Take 75 mg by mouth daily as needed (pain).  07/23/16  Yes Historical  Provider, MD  Multiple Vitamin (MULTIVITAMIN WITH MINERALS) TABS tablet Take 1 tablet by mouth daily.   Yes Historical Provider, MD  predniSONE (DELTASONE) 1 MG tablet Take 5 mg by mouth daily. 08/20/16  Yes Historical Provider, MD  Probiotic Product (ALIGN PO) Take 1 capsule by mouth daily.   Yes Historical Provider, MD  temazepam (RESTORIL) 15 MG capsule Take 15 mg by mouth at bedtime as needed for sleep.  08/21/16  Yes Historical Provider, MD  timolol (TIMOPTIC-XR) 0.5 % ophthalmic gel-forming Place 1 drop into both eyes every morning.  08/05/16  Yes Historical Provider, MD  zolpidem (AMBIEN) 5 MG tablet Take 5 mg by mouth at bedtime as needed for sleep.  09/05/16  Yes Historical Provider, MD  cefUROXime (CEFTIN) 500 MG tablet Take 1 tablet (500 mg total) by mouth 2 (two) times daily with a meal. 09/21/16   Lonia Blood, MD  metoprolol succinate (TOPROL-XL) 25 MG 24 hr tablet Take 0.5 tablets (12.5 mg total) by mouth daily. 09/21/16   Lonia Blood, MD   No Known Allergies Review of Systems Lack of appetite  Physical Exam Older than stated age appearing lady Resting in bed Has bruises and multiple scattered skin tears everywhere including her face Shallow clear breath  sounds S1 S2 Abdomen sot Thin muscle wasting evident Awake not entirely alert  Vital Signs: BP 129/65 (BP Location: Left Arm)   Pulse (!) 124   Temp 97.9 F (36.6 C) (Oral)   Resp 20   Ht 5\' 7"  (1.702 m)   Wt 42.6 kg (93 lb 14.7 oz)   SpO2 92%   BMI 14.71 kg/m  Pain Assessment: 0-10   Pain Score: 3  PPS 30%  SpO2: SpO2: 92 % O2 Device:SpO2: 92 % O2 Flow Rate: .O2 Flow Rate (L/min): 1 L/min  IO: Intake/output summary:  Intake/Output Summary (Last 24 hours) at 09/28/16 1609 Last data filed at 09/28/16 1516  Gross per 24 hour  Intake           482.67 ml  Output              250 ml  Net           232.67 ml    LBM: Last BM Date: 09/24/16 Baseline Weight: Weight: 41.3 kg (91 lb) Most recent weight: Weight: 42.6 kg (93 lb 14.7 oz)     Palliative Assessment/Data:     Time In:  1500 Time Out: 1610 Time Total: 70 minutes  Greater than 50%  of this time was spent counseling and coordinating care related to the above assessment and plan.  Signed by: 09/26/16, MD  [phone]  Please contact Palliative Medicine Team phone at [phone] for questions and concerns.  For individual provider: See [phone]

## 2016-09-28 NOTE — Progress Notes (Signed)
Report given to Claretha Cooper, RN to assume care of patient.  Allison Mendez. [name], RN

## 2016-09-29 LAB — GLUCOSE, CAPILLARY: GLUCOSE-CAPILLARY: 82 mg/dL (ref 65–99)

## 2016-09-29 LAB — CULTURE, BLOOD (ROUTINE X 2)
Culture: NO GROWTH
Culture: NO GROWTH
Special Requests: ADEQUATE
Special Requests: ADEQUATE

## 2016-09-29 MED ORDER — MORPHINE SULFATE (PF) 4 MG/ML IV SOLN: 2.0000 mg | INTRAVENOUS | Status: DC | PRN

## 2016-09-29 NOTE — Clinical Social Work Note (Signed)
Clinical Social Work Assessment  Patient Details  Name: Allison Mendez MRN: [id number] Date of Birth: 02-Jul-1940  Date of referral:  09/29/16               Reason for consult:  End of Life/Hospice (Residential Hospice)                Permission sought to share information with:  Family Supports Permission granted to share information::  Yes, Verbal Permission Granted  Name::     Allison Mendez   Agency::     Relationship::  Sister  Contact Information:  [phone]  Housing/Transportation Living arrangements for the past 2 months:  Apartment Source of Information:  Patient, Other (Comment Required) (Patient's sister) Patient Interpreter Needed:  None Criminal Activity/Legal Involvement Pertinent to Current Situation/Hospitalization:  No - Comment as needed Significant Relationships:  Siblings (Sister) Lives with:  Self Do you feel safe going back to the place where you live?  No Need for family participation in patient care:  Yes (Comment) ([name])  Care giving concerns:  Patient has prognosis < 2 weeks and does not have family that can continuosly care for her. Patient has decreased appetite and unable to ambulate.    Social Worker assessment / plan:  CSW spoke with patient at bedside, patient alert and oriented x 4. Patient reported that she came to [place] from [place] to visit family and initially anticipated returning back to [place].  CSW and patient discussed residential hospice recommendation. Patient stated "don't want to just sit around dying, rather just get it over with". Patient reported that she has been living with pain for the last 25 years. 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 Patient reported that she wanted to discuss her options with her sister. CSW asked for permission to contact patient's sister, patient granted verbal permission. CSW contacted patient's sister, patient's sister reported that she is  not able to care for patient in her home and does not feel that patient can return to her home in [place]. Patient's sister reported that she feels patient needs to go to residential hospice to get the care she needs. Patient's sister reported that she would be at the hospital shortly, CSW agreed to speak with patient and her sister together. CSW spoke with patient and patient's sister, patient reported that she was agreeable to residential hospice after speaking with sister. Patient's sister reported that she is interested in the hospice facility in [facility] because it is closer to her home and she can go see patient. CSW contacted Allison Mendez with [facility] she agreed to come see patient.    Employment status:  Retired Database administrator PT Recommendations:  Skilled Nursing Facility Information / Referral to community resources:  Other (Comment Required) (Residential Hospice)  Patient/Family's Response to care:  Patient and patient's sister agreeable to residential hospice.   Patient/Family's Understanding of and Emotional Response to Diagnosis, Current Treatment, and Prognosis: Patient presented calm while speaking with CSW and got tearful a few times. CSW provided patient with emotional support and affirmed patient's feelings about nearing end of life. Patient verbalized she was unaware about her condition and how much time she had left, noting she was surprised she was still alive. Patient could not verbalize clear understanding of her prognosis but is aware that she may be dying soon.   Emotional Assessment Appearance:  Appears older than stated age  Attitude/Demeanor/Rapport:  Other (Open) Affect (typically observed):  Calm Orientation:  Oriented to Self, Oriented to Place, Oriented to  Time, Oriented to Situation Alcohol / Substance use:  Not Applicable Psych involvement (Current and /or in the community):  No (Comment)  Discharge Needs  Concerns to be  addressed:  Care Coordination Readmission within the last 30 days:  Yes Current discharge risk:  None Barriers to Discharge:  Other (Patient not currently meeting criteria for residential hospice despite prognosis <2 weeks)   Antionette Poles, LCSW 09/29/2016, 2:14 PM

## 2016-09-29 NOTE — Progress Notes (Signed)
PMT progress note.   Patient is resting in bed, slumped over to one side, does not interact much. Is with flat affect, bedside RN reports that the patient did not eat her breakfast.  BP (!) 142/77 (BP Location: Left Arm)   Pulse (!) 129   Temp 98.5 F (36.9 C) (Oral)   Resp 18   Ht 5\' 7"  (1.702 m)   Wt 42.9 kg (94 lb 9.2 oz)   SpO2 98%   BMI 14.81 kg/m  Pertinent labs and imaging noted.  Appears weak Has bruises everywhere Shallow regular breathing No edema Abdomen not distended Awake not alert  A/P: R AKA HTN HLD  Rheumatoid arthritis D CHF Recent E Coli UTI and bacteremia Recent mechanical fall  PLAN: Patient and family have decided to pursue comfort measures CSW consult for residential hospice pending Continue current level of care for now.  Agree with no more labs 15 minutes spent MD [place] palliative medicine team

## 2016-09-29 NOTE — Progress Notes (Signed)
[facility] PROGRESS NOTE  Allison Mendez WUJ:[id number] DOB: February 21, 1941 DOA: 09/24/2016 PCP: Pcp Not In System  Interim summary and history of present illness 76 year old female with medical history significant of right AKA, hypertension, hyperlipidemia, depression, rheumatoid arthritis, diastolic congestive heart failure and recent admission due to CAP, Escherichia coli UTI and Escherichia coli bacteremia. Who was at home recovering from these recent infection doing okay and without acute complaints; and experienced a mechanical fall down her sisters stairs while she was trying to transition herself off her wheelchair and into different level in the house. Patient denies any chest pain, shortness of breath, loss of consciousness or any other prodromic syndrome. In the ED she was found to be hypoxic, with elevated lactic acid, significant elevation of her WBCs, and with multiple skin lacerations, abrasions and bruises. Patient was also tachycardic and meeting SIRS criteria.  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 -Patient blood culture has remained without any growth, UA without signs of acute infection and no infiltrates or acute abnormalities on her chest x-ray seen on admission. -Patient;s last wbc's were improving and pro-calcitonin was only 0.4  -No frank source of infection appreciated at this moment and patient has remained afebrile. -patient completed antibiotic as previously prescribed for UTI and bacteremia (last dose 4/22) -IVF's KVO (mainly to provide medications if needed) -continue supportive care  -last lactic acid was normal -palliative care has discussed with patient and family and decision has been to pursuit residential hospice and Comfort care. -will continue supportive care, end of life treatment and symptomatic management. Appreciate palliative care assistance and  inputs.  2-Mechanical fall -physical therapy recommended SNF. Patient significantly weak and requiring 2++ assistance. -patient open to residential hospice -fear that she doesn't have much stamina for rehab at this point.   3-acute respiratory failure with hypoxia -No acute abnormalities seen on chest x-ray -Patient has improved significantly with oxygen supplementation and currently using only 1 L of oxygen with 96-100 sat (at rest). Even she appears tachypneic and unable to speak in full sentences. -will continue weaning oxygen off as toelrated -questionable if initial measurement was accurate or not -Patient did not use oxygen at baseline. -for now will focus mainly on comfort   4-normocytic anemia -Patient with a decrease of her hemoglobin from recent blood work -Probably associated with blood loss with skin tear -Patient denies hematuria, hematochezia, melena, hematemesis or any other source/signs of bleeding prior to admission. No bleeding , other than oozing from wound has been seen here either.  -no more blood draws. Plan is for comfort care  5-hypertension and tachycardia  -Will continue metoprolol -will d/c telemetry   6-chronic diastolic heart failure -Compensated and stable currently  -No signs of fluid overload on exam or vascular congestion appreciated on chest x-ray -Will follow daily weights and strict intake and output -no crackles on auscultation   7-rheumatoid arthritis -Patient received Solu-Cortef 50 mg 1 as a stress dose on admission  -Cortisol level within normal limits -will continue Home prednisone regimen   8-hypokalemia -last potassium WNL -no further blood draws -will monitor on comfort care  9-moderate protein calorie malnutrition -will focus on comfort feeding   10-Numerous and severe skin tears to right arm, left thigh, left knee laceration, skin tears to the left anterior LE, bilateral ankles. -no superimposed infections seen -will follow  wound care recommendations   Code Status: DO NOT RESUSCITATE Family Communication: sister at bedside  Disposition Plan: following discussion from palliative care and GOC meeting; will pursuit comfort care and residential hospice. Currently been evaluated by [facility] for acceptancy.  Consultants:   None  Procedures:   see Below for x-ray reports  Antibiotics:  Vancomycin and Zosyn 09/24/16>>09/26/16  Ceftin 4/20>>4/22  HPI/Subjective: Patient with very flat affect, expressed not feeling good. Weak, tired and having elevated RR. Patient is afebrile. No nausea, no vomiting. Not eating anything currently. Was tachypneic on exam and expressed generalized pain.  Objective: Vitals:   09/29/16 0504 09/29/16 1612  BP: (!) 142/77 110/73  Pulse: (!) 129 (!) 117  Resp: 18 17  Temp: 98.5 F (36.9 C) 98 F (36.7 C)    Intake/Output Summary (Last 24 hours) at 09/29/16 1736 Last data filed at 09/29/16 1617  Gross per 24 hour  Intake           470.16 ml  Output                1 ml  Net           469.16 ml   Filed Weights   09/27/16 0431 09/28/16 0454 09/29/16 0504  Weight: 43.8 kg (96 lb 9 oz) 42.6 kg (93 lb 14.7 oz) 42.9 kg (94 lb 9.2 oz)    Exam:   General: patient with depressed mood and very weak on assessment. Not eating anything. Using 1L oxygen supplementation. Patient endorses chronic generalized pain. tachypneic on exam.  Cardiovascular: tachycardic. No rubs, no gallops. Positive SEM. No JVD  Respiratory: positive tachypnea. No wheezing, no crackles, positive rhonchi diffusely.    Abdomen: soft, NT, ND, positive BS  Musculoskeletal: multiple open skin tears and bruises appreciated on her extremities and face. +1+ pedal edema on her left lower extremity and positive right AKA. No joint swelling appreciated on her joints, multiple deformities affecting her foot and hands bilaterally (from RA). There is no signs of superimposed infections in her wounds.  Serosanguineous oozing appreciated as part of drainage (especially from her LLE).   Data Reviewed: Basic Metabolic Panel:  Recent Labs Lab 09/24/16 1530 09/25/16 0321 09/26/16 0215  NA 141 136 140  K 3.2* 2.9* 4.8  CL 100* 96* 108  CO2 34* 25 27  GLUCOSE 109* 164* 107*  BUN 11 48* 8  CREATININE 0.54 0.72 0.37*  CALCIUM 8.5* 7.4* 7.6*  MG  --  1.2* 1.6*   Liver Function Tests:  Recent Labs Lab 09/24/16 2256 09/25/16 0321  AST 123* 39  ALT 121* 36  ALKPHOS 148* 57  BILITOT 2.9* 1.4*  PROT 5.0* 6.1*  ALBUMIN 2.6* 3.7   CBC:  Recent Labs Lab 09/24/16 1530 09/24/16 1711 09/24/16 2256 09/26/16 0323  WBC 29.1* 20.3* 14.3* 13.7*  NEUTROABS 27.0*  --   --   --   HGB 12.0 9.5* 9.3* 8.1*  HCT 36.7 29.4* 28.3* 25.0*  MCV 97.6 98.0 96.3 99.2  PLT 266 188 197 231    BNP (last 3 results)  Recent Labs  09/24/16 2256  BNP 96.0    CBG:  Recent Labs Lab 09/25/16 2112 09/26/16 0753 09/27/16 0819 09/27/16 0906 09/29/16 0739  GLUCAP 122* 84 62* 103* 82    Recent Results (from the past 240 hour(s))  Blood Culture (routine x 2)     Status: None   Collection Time: 09/24/16  4:23 PM  Result Value Ref Range Status   Specimen Description BLOOD LEFT ANTECUBITAL  Final   Special Requests   Final  BOTTLES DRAWN AEROBIC AND ANAEROBIC Blood Culture adequate volume   Culture   Final    NO GROWTH 5 DAYS Performed at [facility], [address], [place], [place] [address]    Report Status 09/29/2016 FINAL  Final  Blood Culture (routine x 2)     Status: None   Collection Time: 09/24/16  4:23 PM  Result Value Ref Range Status   Specimen Description BLOOD BLOOD LEFT HAND  Final   Special Requests IN PEDIATRIC BOTTLE Blood Culture adequate volume  Final   Culture   Final    NO GROWTH 5 DAYS Performed at [facility], [address] [place], [place] [address]    Report Status 09/29/2016 FINAL  Final  MRSA PCR Screening     Status: None   Collection  Time: 09/24/16  9:11 PM  Result Value Ref Range Status   MRSA by PCR NEGATIVE NEGATIVE Final    Comment:        The GeneXpert MRSA Assay (FDA approved for NASAL specimens only), is one component of a comprehensive MRSA colonization surveillance program. It is not intended to diagnose MRSA infection nor to guide or monitor treatment for MRSA infections.      Studies: No results found.  Scheduled Meds: . amitriptyline  20 mg Oral QHS  . bifidobacterium infantis  1 capsule Oral Daily  . cyclobenzaprine  10 mg Oral Daily  . metoprolol tartrate  25 mg Oral BID  . multivitamin with minerals  1 tablet Oral Daily  . predniSONE  5 mg Oral Daily  . sodium chloride flush  3 mL Intravenous Q12H  . timolol  1 drop Both Eyes Daily   Continuous Infusions: . sodium chloride 10 mL/hr at 09/27/16 1310    Principal Problem:   SIRS (systemic inflammatory response syndrome) (HCC) Active Problems:   Hypertension   RA (rheumatoid arthritis) (HCC)   PVD (peripheral vascular disease) (HCC)   Normocytic anemia   Hypokalemia   Fall   Protein-calorie malnutrition, moderate (HCC)   Chronic diastolic CHF (congestive heart failure) (HCC)    Time spent: 15 minutes    Vassie Loll  [facility] Pager [phone]. If 7PM-7AM, please contact night-coverage at [email or web address], password [facility] 09/29/2016, 5:36 PM  LOS: 5 days

## 2016-09-29 NOTE — Consult Note (Signed)
[place]: Met with pt and her sister whom she is staying with at this time. Discussed hospice philosophy and goals of care. She is a DNR and the sister states that she is given up and has stated several times she doesn't want to live this way anymore. She has multiple skin tears from a recent fall due to weakness and increased confusion. The pt did not eat but 2 bites of her lunch and the sister states this has been going on now for 3 days. She has just stopped showing interest in food. The pt has slurred speech and her eyes look weak. She has a right amputation which does limit her ability to walk.  I spoke to the medical director at [facility] and she is not meeting residential inpatient criteria at this time. She has no unmanaged symptoms to qualify her. Discussed that we would like to reevaluate tomorrow because she does appear to be very frail and a lot can change in the next 24 hours. If we visit tomorrow and out doctor doesn't feel she continues to meet residential criteria then the sister is willing to take her home with hospice support and when she is appropriate for hospice home then transfer to our facility. Spoke to SW to update her on the plan as well.   [facility] Liaison with [place] Cell: [phone]

## 2016-09-29 NOTE — Progress Notes (Signed)
Dressings changed per wound care orders. Mepitel dressings replaced.  Allison Mendez. [name], RN

## 2016-09-29 NOTE — Progress Notes (Signed)
Physical Therapy Discharge Patient Details Name: Allison Mendez MRN: [id number] DOB: Apr 03, 1941 Today's Date: 09/29/2016 Time:  -     Patient discharged from PT services secondary to medical decline - will need to re-order PT to resume therapy services.Palliative Care consult noted. Plans for patient to transition to Residential Hospice and comfort care.   Please see latest therapy progress note for current level of functioning and progress toward goals.      GP     Sharen Heck PT [phone]  09/29/2016, 7:46 AM

## 2016-09-29 NOTE — Care Management Important Message (Signed)
Important Message  Patient Details  Name: Allison Mendez MRN: [id number] Date of Birth: [birth year]   Medicare Important Message Given:  Yes    Caren Macadam 09/29/2016, 11:18 AM

## 2016-09-30 DIAGNOSIS — R0600 Dyspnea, unspecified: Secondary | ICD-10-CM

## 2016-09-30 DIAGNOSIS — Z515 Encounter for palliative care: Secondary | ICD-10-CM

## 2016-09-30 DIAGNOSIS — I5032 Chronic diastolic (congestive) heart failure: Secondary | ICD-10-CM

## 2016-09-30 LAB — GLUCOSE, CAPILLARY: GLUCOSE-CAPILLARY: 80 mg/dL (ref 65–99)

## 2016-09-30 MED ORDER — ALBUTEROL SULFATE (2.5 MG/3ML) 0.083% IN NEBU: 2.5000 mg | INHALATION_SOLUTION | Freq: Four times a day (QID) | RESPIRATORY_TRACT | 12 refills | Status: AC | PRN

## 2016-09-30 MED ORDER — METOPROLOL SUCCINATE ER 50 MG PO TB24: 50.0000 mg | ORAL_TABLET | Freq: Every day | ORAL | Status: AC

## 2016-09-30 MED ORDER — ACETAMINOPHEN 325 MG PO TABS: 650.0000 mg | ORAL_TABLET | Freq: Four times a day (QID) | ORAL | Status: AC | PRN

## 2016-09-30 NOTE — Progress Notes (Signed)
Pt discharging to hospice.  The accepting facility has asked that we not remove the IV.  Will leave in place.

## 2016-09-30 NOTE — Progress Notes (Signed)
CSW contacted by Hospice Liaison Cheri, Hospice Liaison reported that they are able to accept patient at residential hospice today. CSW will follow and assist patient with discharge to Hospice Home at [facility].   Celso Sickle, [place] Clinical Social Worker [facility] Cell#: [phone]

## 2016-09-30 NOTE — Progress Notes (Signed)
Palliative Care Progress Note  Reason for consult: Goals of care, disposition, pain and non-pain symptom management  Patient lying in bed, slumped over to one side.  She reports feeling worse today with increased SOB.  Chart reviewed.  Plan for reevaluation for residential hospice placement today.  While this is my first encounter today, she does appear to have uncontrolled SOB.  BP 127/67 (BP Location: Left Arm)   Pulse (!) 115   Temp 98.5 F (36.9 C) (Oral)   Resp 19   Ht 5\' 7"  (1.702 m)   Wt 42.9 kg (94 lb 9.2 oz)   SpO2 90%   BMI 14.81 kg/m   Appears weak Has bruises everywhere Shallow regular breathing No edema Abdomen not distended Awake not alert  A/P: R AKA HTN HLD  Rheumatoid arthritis D CHF Recent E Coli UTI and bacteremia Recent mechanical fall  PLAN: Plan is to pursue comfort measures Evaluation for residential hospice pending Continue current care for now.  Continue morphine prn pain or SOB.   I believe her prognosis is likely less than 2 weeks.  She appears to be having increased symptom burden.  I believe that she will be best served by placement at residential hospice if it can be arranged.  Total time: 20 minutes Greater than 50%  of this time was spent counseling and coordinating care related to the above assessment and plan.  , MD [facility] Palliative Medicine Team [phone]

## 2016-09-30 NOTE — Progress Notes (Signed)
Patient discharging to Hospice Home at [facility] PTAR contacted, family aware. Patient's RN previously provided with number to call report.   Celso Sickle, [place] Clinical Social Worker [facility] Cell#: [phone]

## 2016-09-30 NOTE — Discharge Summary (Signed)
Physician Discharge Summary  Allison Mendez ZOX:[id number] DOB: [birth year] DOA: 09/24/2016  PCP: Pcp Not In System  Admit date: 09/24/2016 Discharge date: 09/30/2016  Time spent: 35 minutes  Recommendations for Outpatient Follow-up:  1. Full comfort care  Discharge Diagnoses:  Principal Problem:   SIRS (systemic inflammatory response syndrome) (HCC) Active Problems:   Hypertension   RA (rheumatoid arthritis) (HCC)   PVD (peripheral vascular disease) (HCC)   Normocytic anemia   Hypokalemia   Fall   Protein-calorie malnutrition, moderate (HCC)   Chronic diastolic CHF (congestive heart failure) (HCC)   Discharge Condition: stable and with normal VS currently. Will be transfer to residential hospice for further symptomatic management and end of life care.   Diet recommendation: comfort feeding   Filed Weights   09/27/16 0431 09/28/16 0454 09/29/16 0504  Weight: 43.8 kg (96 lb 9 oz) 42.6 kg (93 lb 14.7 oz) 42.9 kg (94 lb 9.2 oz)    History of present illness:  76 year old female with medical history significant of right AKA, hypertension, hyperlipidemia, depression, rheumatoid arthritis, diastolic congestive heart failure and recent admission due to CAP, Escherichia coli UTI and Escherichia coli bacteremia. Who was at home recovering from these recent infection doing okay and without acute complaints; and experienced a mechanical fall down her sisters stairs while she was trying to transition herself off her wheelchair and into different level in the house. Patient denies any chest pain, shortness of breath, loss of consciousness or any other prodromic syndrome. In the ED she was found to be hypoxic, with elevated lactic acid, significant elevation of her WBCs, and with multiple skin lacerations, abrasions and bruises. Patient was also tachycardic and meeting SIRS criteria.  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 -Patient blood culture has remained without any growth, UA without signs of acute infection and no infiltrates or acute abnormalities on her chest x-ray seen on admission. -Patient;s last wbc's were improving and pro-calcitonin was only 0.4  -No frank source of infection appreciated at this moment and patient has remained afebrile. -patient completed antibiotic as previously prescribed for UTI and bacteremia (last dose 4/22) -IVF's KVO (mainly to provide medications if needed) -continue supportive care  -last lactic acid was normal -palliative care has discussed with patient and family and decision has been to pursuit residential hospice and Comfort care. -will continue supportive care, end of life treatment and symptomatic management. Appreciate palliative care assistance and inputs. -patient transferred to [facility] residential hospice   2-Mechanical fall -physical therapy recommended SNF. Patient significantly weak and requiring 2++ assistance. -patient open to residential hospice -fear that she doesn't have much stamina for rehab at this point.   3-acute respiratory failure with hypoxia -No acute abnormalities seen on chest x-ray -Patient has improved significantly with oxygen supplementation and currently using only 1 L of oxygen with 96-100 sat (at rest). Even she appears tachypneic and unable to speak in full sentences. -will continue weaning oxygen off as toelrated -questionable if initial measurement was accurate or not -Patient did not use oxygen at baseline. -for now will focus mainly on comfort and continue supplementation as needed   4-normocytic anemia -Patient with a decrease of her hemoglobin from recent blood work -Probably associated with blood loss with skin tear -Patient denies hematuria, hematochezia, melena, hematemesis or any other source/signs of bleeding prior to admission. No bleeding , other than oozing from wound has been  seen here  either.  -no more blood draws. Plan is for comfort care only  5-hypertension and tachycardia  -Will continue metoprolol  6-chronic diastolic heart failure -Compensated and stable currently  -No signs of fluid overload on exam or vascular congestion appreciated on chest x-ray. -no crackles on auscultation  -planning for comfort care only -continue b-blocker   7-rheumatoid arthritis -Patient received Solu-Cortef 50 mg 1 as a stress dose on admission  -Cortisol level was within normal limits -will continue Home prednisone regimen   8-hypokalemia -last potassium WNL -no further blood draws -now comfort care  9-moderate protein calorie malnutrition -will focus on comfort feeding   10-Numerous and severe skin tears to right arm, left thigh, left knee laceration, skin tears to the left anterior LE, bilateral ankles. -no superimposed infections seen -will continue wound care as part of comfort measures (if they are not painful)  Procedures:  See below for x-ray reports   Consultations:  Palliative care   Discharge Exam: Vitals:   09/30/16 0458 09/30/16 1251  BP: 129/70 127/67  Pulse: 99 (!) 115  Resp: 18 19  Temp: 97.9 F (36.6 C) 98.5 F (36.9 C)    General: patient with decrease appetite and very poor intake. Slight increase in her resp rate and asking not to let her suffer. AAOX3. Reported generalized pain.  Cardiovascular: tachycardic. No rubs, no gallops. Positive SEM. No JVD  Respiratory: positive tachypnea. No wheezing, no crackles, positive rhonchi diffusely.    Abdomen: soft, NT, ND, positive BS  Musculoskeletal: multiple open skin tears and bruises appreciated on her extremities and face. +1+ pedal edema on her left lower extremity and positive right AKA. No joint swelling appreciated on her joints, multiple deformities affecting her foot and hands bilaterally (from RA). There is no signs of superimposed infections in her wounds.  Serosanguineous oozing appreciated as part of drainage (especially from her LLE).   Discharge Instructions   Discharge Instructions    Discharge instructions    Complete by:  As directed    Symptomatic management and end of life care Cmfort feeding No further hospitalizations     Current Discharge Medication List    START taking these medications   Details  acetaminophen (TYLENOL) 325 MG tablet Take 2 tablets (650 mg total) by mouth every 6 (six) hours as needed for mild pain (or Fever >/= 101).    albuterol (PROVENTIL) (2.5 MG/3ML) 0.083% nebulizer solution Take 3 mLs (2.5 mg total) by nebulization every 6 (six) hours as needed for wheezing or shortness of breath. Qty: 75 mL, Refills: 12      CONTINUE these medications which have CHANGED   Details  metoprolol succinate (TOPROL-XL) 50 MG 24 hr tablet Take 1 tablet (50 mg total) by mouth daily.      CONTINUE these medications which have NOT CHANGED   Details  amitriptyline (ELAVIL) 10 MG tablet Take 20 mg by mouth at bedtime. Refills: 0    carisoprodol (SOMA) 350 MG tablet Take 350 mg by mouth at bedtime as needed (pain).  Refills: 0    cyclobenzaprine (FLEXERIL) 10 MG tablet Take 10 mg by mouth daily. Refills: 0    HYDROcodone-acetaminophen (NORCO) 10-325 MG tablet Take 1 tablet by mouth every 4 (four) hours as needed for moderate pain (Do not exceed 4 tablets per day).     LYRICA 75 MG capsule Take 75 mg by mouth daily as needed (pain).     predniSONE (DELTASONE) 1 MG tablet Take 5 mg by mouth daily.  Probiotic Product (ALIGN PO) Take 1 capsule by mouth daily.    temazepam (RESTORIL) 15 MG capsule Take 15 mg by mouth at bedtime as needed for sleep.     timolol (TIMOPTIC-XR) 0.5 % ophthalmic gel-forming Place 1 drop into both eyes every morning.       STOP taking these medications     alendronate (FOSAMAX) 70 MG tablet      atorvastatin (LIPITOR) 20 MG tablet      cholecalciferol (VITAMIN D) 1000 units  tablet      Flaxseed, Linseed, (FLAXSEED OIL PO)      ibuprofen (ADVIL,MOTRIN) 600 MG tablet      Multiple Vitamin (MULTIVITAMIN WITH MINERALS) TABS tablet      zolpidem (AMBIEN) 5 MG tablet      cefUROXime (CEFTIN) 500 MG tablet        No Known Allergies   The results of significant diagnostics from this hospitalization (including imaging, microbiology, ancillary and laboratory) are listed below for reference.    Significant Diagnostic Studies: Dg Chest 2 View  Result Date: 09/24/2016 CLINICAL DATA:  Fall down stairs today.  Initial encounter. EXAM: CHEST  2 VIEW COMPARISON:  09/17/2016 and prior exam FINDINGS: The cardiomediastinal silhouette is unremarkable. Elevation of the right hemidiaphragm and right basilar scarring again noted. Surgical clips overlying the right axilla noted. There is no evidence of focal airspace disease, pulmonary edema, suspicious pulmonary nodule/mass, pleural effusion, or pneumothorax. No acute bony abnormalities are identified. IMPRESSION: No active cardiopulmonary disease. Electronically Signed   By: Harmon Pier M.D.   On: 09/24/2016 14:09   Dg Chest 2 View  Result Date: 09/17/2016 CLINICAL DATA:  Acute onset of confusion and lethargy. Status post fall out of bed 2 days ago. Initial encounter. EXAM: CHEST  2 VIEW COMPARISON:  None. FINDINGS: The lungs are well-aerated. Mild bibasilar opacities raise question for mild pneumonia. There is no evidence of pleural effusion or pneumothorax. The heart is borderline normal in size. No acute osseous abnormalities are seen. IMPRESSION: Mild bibasilar opacities raise question for mild pneumonia. Electronically Signed   By: Roanna Raider M.D.   On: 09/17/2016 21:48   Dg Tibia/fibula Left  Result Date: 09/24/2016 CLINICAL DATA:  Acute left lower leg pain following fall today. Initial encounter. EXAM: LEFT TIBIA AND FIBULA - 2 VIEW COMPARISON:  None FINDINGS: Lateral soft tissue swelling noted. There is no evidence  of acute fracture, subluxation or dislocation. No focal bony lesions are present. Vascular calcifications are noted. IMPRESSION: Soft tissue swelling without acute bony abnormality. Electronically Signed   By: Harmon Pier M.D.   On: 09/24/2016 14:07   Ct Head Wo Contrast  Result Date: 09/24/2016 CLINICAL DATA:  Patient fell down some steps today, has abrasion to mid upper frontal region, neck pains when moving neck, hx of PVD, HTN, rheumatoid arthritis, no other complaints EXAM: CT HEAD WITHOUT CONTRAST CT CERVICAL SPINE WITHOUT CONTRAST TECHNIQUE: Multidetector CT imaging of the head and cervical spine was performed following the standard protocol without intravenous contrast. Multiplanar CT image reconstructions of the cervical spine were also generated. COMPARISON:  09/17/2016 FINDINGS: CT HEAD FINDINGS Brain: No evidence of acute infarction, hemorrhage, extra-axial collection, ventriculomegaly, or mass effect. Generalized cerebral atrophy. Periventricular white matter low attenuation likely secondary to microangiopathy. Vascular: Cerebrovascular atherosclerotic calcifications are noted. Skull: Negative for fracture or focal lesion. Sinuses/Orbits: Visualized portions of the orbits are unremarkable. Visualized portions of the paranasal sinuses and mastoid air cells are unremarkable. Other: Left frontal scalp soft  tissue swelling. CT CERVICAL SPINE FINDINGS Alignment: 3 mm anterolisthesis of C2 on C3 secondary to facet disease. 3 mm anterolisthesis of C3 on C4 secondary to facet disease. Skull base and vertebrae: No acute fracture. No primary bone lesion or focal pathologic process. Soft tissues and spinal canal: No prevertebral fluid or swelling. No visible canal hematoma. Disc levels: Osseous fusion across the C4-5 disc space. Severe degenerative disc disease with disc height loss at C5-6 and C6-7. Severe right and mild left facet arthropathy at C2-3. Severe left facet arthropathy with left foraminal  stenosis at C3-4. Broad-based disc osteophyte complex at C4-5 with bilateral uncovertebral degenerative changes and right facet arthropathy resulting in bilateral foraminal stenosis. At C5-6 there is a broad-based disc osteophyte complex with bilateral uncovertebral degenerative changes and bilateral foraminal stenosis. At C6-7 there is a broad-based disc osteophyte complex with bilateral uncovertebral degenerative changes with bilateral foraminal stenosis. Upper chest: Lung apices are clear. Other: No fluid collection or hematoma. Bilateral carotid artery atherosclerosis. IMPRESSION: 1. No acute intracranial pathology. 2. No acute osseous injury the cervical spine. 3. 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 Electronically Signed   By: Elige Ko   On: 09/24/2016 13:47   Ct Head Wo Contrast  Result Date: 09/17/2016 CLINICAL DATA:  Status post fall 2 days ago, with injury to the head. Confusion. Initial encounter. EXAM: CT HEAD WITHOUT CONTRAST TECHNIQUE: Contiguous axial images were obtained from the base of the skull through the vertex without intravenous contrast. COMPARISON:  None. FINDINGS: Brain: No evidence of acute infarction, hemorrhage, hydrocephalus, extra-axial collection or mass lesion/mass effect. Prominence of the ventricles and sulci reflects mild to moderate cortical volume loss. Mild cerebellar atrophy is noted. Scattered periventricular and subcortical white matter change likely reflects small vessel ischemic microangiopathy. Chronic ischemic change is noted at the external capsule bilaterally. The brainstem and fourth ventricle are within normal limits. The cerebral hemispheres demonstrate grossly normal gray-white differentiation. No mass effect or midline shift is seen. Vascular: No hyperdense vessel or unexpected calcification. Skull: There is no evidence of fracture; visualized osseous structures are unremarkable in appearance. Sinuses/Orbits: The visualized portions of  the orbits are within normal limits. The paranasal sinuses and mastoid air cells are well-aerated. Other: No significant soft tissue abnormalities are seen. IMPRESSION: 1. No evidence of traumatic intracranial injury or fracture. 2. Mild to moderate cortical volume loss and scattered small vessel ischemic microangiopathy. 3. Chronic ischemic change at the external capsule bilaterally. Electronically Signed   By: Roanna Raider M.D.   On: 09/17/2016 21:49   Ct Angio Chest Pe W And/or Wo Contrast  Result Date: 09/24/2016 CLINICAL DATA:  Acute onset of intermittent shortness of breath. Initial encounter. EXAM: CT ANGIOGRAPHY CHEST WITH CONTRAST TECHNIQUE: Multidetector CT imaging of the chest was performed using the standard protocol during bolus administration of intravenous contrast. Multiplanar CT image reconstructions and MIPs were obtained to evaluate the vascular anatomy. CONTRAST:  100 mL of Isovue 370 IV contrast COMPARISON:  Chest radiograph performed earlier today at 1:30 p.m. FINDINGS: Cardiovascular:  There is no evidence of pulmonary embolus. The heart is normal in size. Scattered calcification is noted along the thoracic aorta and proximal great vessels. Mediastinum/Nodes: The mediastinum is unremarkable appearance. No mediastinal lymphadenopathy is seen. No pericardial effusion is identified. The thyroid gland is grossly unremarkable. No axillary lymphadenopathy is appreciated. Lungs/Pleura: A trace right pleural effusion is noted, with associated atelectasis. No pneumothorax is seen. No masses are identified. Upper Abdomen: The visualized portions of the  liver and spleen are grossly unremarkable. A patent vascular stent is noted at the origin of the celiac trunk. Musculoskeletal: No acute osseous abnormalities are identified. Right convex thoracic scoliosis is noted. There is chronic deformity of the body of the sternum. The visualized musculature is unremarkable in appearance. Review of the MIP  images confirms the above findings. IMPRESSION: 1. No evidence of pulmonary embolus. 2. Trace right pleural effusion, with associated atelectasis. 3. Scattered aortic atherosclerosis. 4. Right convex thoracic scoliosis noted. Electronically Signed   By: Roanna Raider M.D.   On: 09/24/2016 18:41   Ct Cervical Spine Wo Contrast  Result Date: 09/24/2016 CLINICAL DATA:  Patient fell down some steps today, has abrasion to mid upper frontal region, neck pains when moving neck, hx of PVD, HTN, rheumatoid arthritis, no other complaints EXAM: CT HEAD WITHOUT CONTRAST CT CERVICAL SPINE WITHOUT CONTRAST TECHNIQUE: Multidetector CT imaging of the head and cervical spine was performed following the standard protocol without intravenous contrast. Multiplanar CT image reconstructions of the cervical spine were also generated. COMPARISON:  09/17/2016 FINDINGS: CT HEAD FINDINGS Brain: No evidence of acute infarction, hemorrhage, extra-axial collection, ventriculomegaly, or mass effect. Generalized cerebral atrophy. Periventricular white matter low attenuation likely secondary to microangiopathy. Vascular: Cerebrovascular atherosclerotic calcifications are noted. Skull: Negative for fracture or focal lesion. Sinuses/Orbits: Visualized portions of the orbits are unremarkable. Visualized portions of the paranasal sinuses and mastoid air cells are unremarkable. Other: Left frontal scalp soft tissue swelling. CT CERVICAL SPINE FINDINGS Alignment: 3 mm anterolisthesis of C2 on C3 secondary to facet disease. 3 mm anterolisthesis of C3 on C4 secondary to facet disease. Skull base and vertebrae: No acute fracture. No primary bone lesion or focal pathologic process. Soft tissues and spinal canal: No prevertebral fluid or swelling. No visible canal hematoma. Disc levels: Osseous fusion across the C4-5 disc space. Severe degenerative disc disease with disc height loss at C5-6 and C6-7. Severe right and mild left facet arthropathy at C2-3.  Severe left facet arthropathy with left foraminal stenosis at C3-4. Broad-based disc osteophyte complex at C4-5 with bilateral uncovertebral degenerative changes and right facet arthropathy resulting in bilateral foraminal stenosis. At C5-6 there is a broad-based disc osteophyte complex with bilateral uncovertebral degenerative changes and bilateral foraminal stenosis. At C6-7 there is a broad-based disc osteophyte complex with bilateral uncovertebral degenerative changes with bilateral foraminal stenosis. Upper chest: Lung apices are clear. Other: No fluid collection or hematoma. Bilateral carotid artery atherosclerosis. IMPRESSION: 1. No acute intracranial pathology. 2. No acute osseous injury the cervical spine. 3. 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 Electronically Signed   By: Elige Ko   On: 09/24/2016 13:47   Dg Foot Complete Left  Result Date: 09/24/2016 CLINICAL DATA:  Anterior left foot pain after a fall down steps this morning. EXAM: LEFT FOOT - COMPLETE 3+ VIEW COMPARISON:  None. FINDINGS: Diffuse bone demineralization. Deformity of the distal left fifth metatarsal bone may be due to postoperative change or bone resorption from erosive arthritic process. Soft tissue swelling over the dorsum of the foot. Diffuse vascular calcifications. No evidence of acute fracture or dislocation. No focal bone lesion. IMPRESSION: Diffuse bone demineralization. No acute fractures identified. Old resection or resorption of the distal fifth metatarsal bone. Electronically Signed   By: Burman Nieves M.D.   On: 09/24/2016 23:35   Dg Hip Port Unilat With Pelvis 1v Left  Result Date: 09/18/2016 CLINICAL DATA:  76 y/o F; fall 2 days ago with lateral left hip pain. EXAM: DG  HIP (WITH OR WITHOUT PELVIS) 1V PORT LEFT COMPARISON:  None. FINDINGS: There is no evidence of hip fracture or dislocation. There is no evidence of arthropathy or other focal bone abnormality. Extensive vascular calcification.  IMPRESSION: No acute fracture or dislocation identified. Electronically Signed   By: Mitzi Hansen M.D.   On: 09/18/2016 20:59   US Abdomen Limited Ruq  Result Date: 09/18/2016 CLINICAL DATA:  Elevated LFTs. EXAM: US ABDOMEN LIMITED - RIGHT UPPER QUADRANT COMPARISON:  None FINDINGS: Gallbladder: No gallstones, wall thickening, pericholecystic fluid or sonographic Murphy sign. Common bile duct: Diameter: 5.7 mm Liver: Normal echogenicity without focal lesion or biliary dilatation. IMPRESSION: Unremarkable right upper quadrant ultrasound examination. Electronically Signed   By: Rudie Meyer M.D.   On: 09/18/2016 19:01    Microbiology: Recent Results (from the past 240 hour(s))  Blood Culture (routine x 2)     Status: None   Collection Time: 09/24/16  4:23 PM  Result Value Ref Range Status   Specimen Description BLOOD LEFT ANTECUBITAL  Final   Special Requests   Final    BOTTLES DRAWN AEROBIC AND ANAEROBIC Blood Culture adequate volume   Culture   Final    NO GROWTH 5 DAYS Performed at [facility], [address], [place], [place] [address]    Report Status 09/29/2016 FINAL  Final  Blood Culture (routine x 2)     Status: None   Collection Time: 09/24/16  4:23 PM  Result Value Ref Range Status   Specimen Description BLOOD BLOOD LEFT HAND  Final   Special Requests IN PEDIATRIC BOTTLE Blood Culture adequate volume  Final   Culture   Final    NO GROWTH 5 DAYS Performed at [facility], [address], [place], [place] [address]    Report Status 09/29/2016 FINAL  Final  MRSA PCR Screening     Status: None   Collection Time: 09/24/16  9:11 PM  Result Value Ref Range Status   MRSA by PCR NEGATIVE NEGATIVE Final    Comment:        The GeneXpert MRSA Assay (FDA approved for NASAL specimens only), is one component of a comprehensive MRSA colonization surveillance program. It is not intended to diagnose MRSA infection nor to guide or monitor treatment for MRSA  infections.      Labs: Basic Metabolic Panel:  Recent Labs Lab 09/24/16 1530 09/25/16 0321 09/26/16 0215  NA 141 136 140  K 3.2* 2.9* 4.8  CL 100* 96* 108  CO2 34* 25 27  GLUCOSE 109* 164* 107*  BUN 11 48* 8  CREATININE 0.54 0.72 0.37*  CALCIUM 8.5* 7.4* 7.6*  MG  --  1.2* 1.6*   Liver Function Tests:  Recent Labs Lab 09/24/16 2256 09/25/16 0321  AST 123* 39  ALT 121* 36  ALKPHOS 148* 57  BILITOT 2.9* 1.4*  PROT 5.0* 6.1*  ALBUMIN 2.6* 3.7   CBC:  Recent Labs Lab 09/24/16 1530 09/24/16 1711 09/24/16 2256 09/26/16 0323  WBC 29.1* 20.3* 14.3* 13.7*  NEUTROABS 27.0*  --   --   --   HGB 12.0 9.5* 9.3* 8.1*  HCT 36.7 29.4* 28.3* 25.0*  MCV 97.6 98.0 96.3 99.2  PLT 266 188 197 231   BNP (last 3 results)  Recent Labs  09/24/16 2256  BNP 96.0    CBG:  Recent Labs Lab 09/26/16 0753 09/27/16 0819 09/27/16 0906 09/29/16 0739 09/30/16 0732  GLUCAP 84 62* 103* 82 80    Signed:  Vassie Loll MD.  [facility] 09/30/2016, 2:54 PM

## 2016-09-30 NOTE — Consult Note (Signed)
Follow up reevaluation of pt today. She is awake and talking. She appears to have increased SOB with rest and 3 word dyspnea resting. She also having unmanaged pain. Rating it a 5/10 at this time. She has been getting medication consistently for the pain but it appears to not be helping as it was. The pt was accepted to residential hospice for symptom management of pain and SOB. She is also more confused and unsure of where she is today. She does not remember meeting me yesterday and the pt is also hallucinating at times in her room during my visit. Met with her sister Pamala Hurry which signs the consents for pt. She is the only living relative. The pt has never been married and has no children. Spoke to the SW and she will arrange for pick up by [facility] to the hospice home in [facility] around [address] [facility] [place]  [phone]

## 2016-09-30 NOTE — Progress Notes (Signed)
Pt discharging to Hospice home in [place].

## 2016-12-07 DEATH — deceased

## 2019-02-24 IMAGING — CT CT HEAD W/O CM
3 of 7 series · 15 of 47 positions shown, 18 images · non-contrast
Comparison: 09/17/2016

CLINICAL DATA: Patient fell down some steps today, has abrasion to
mid upper frontal region, neck pains when moving neck, hx of PVD,
HTN, rheumatoid arthritis, no other complaints

EXAM:
CT HEAD WITHOUT CONTRAST
CT CERVICAL SPINE WITHOUT CONTRAST
TECHNIQUE: Multidetector CT imaging of the head and cervical spine was
performed following the standard protocol without intravenous
contrast. Multiplanar CT image reconstructions of the cervical spine
were also generated.

[Series 4: head 3.0 mpr cor · coronal · 0.30mm/px · 3 of 67 slices shown]
[im 23/67  brain]
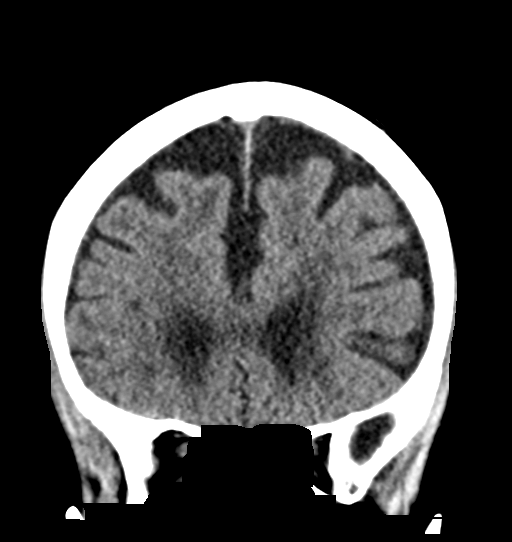
[im 34/67  brain]
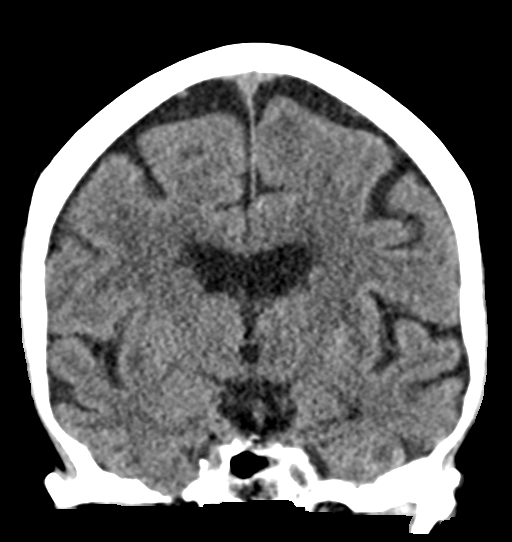
[im 45/67  brain]
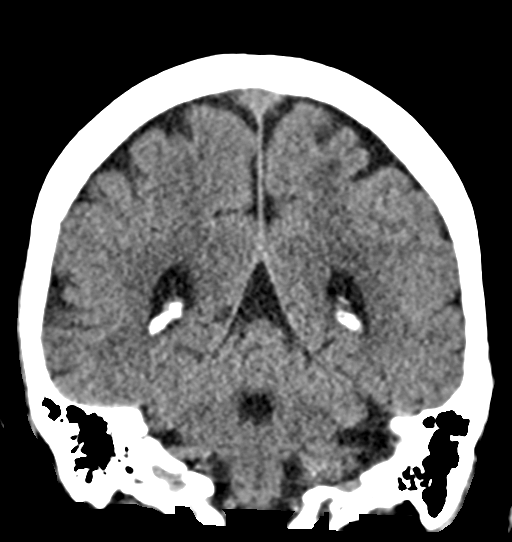

[Series 10: sagittals · sagittal · 0.30mm/px · 2 of 61 slices shown]
[im 21/61  brain]
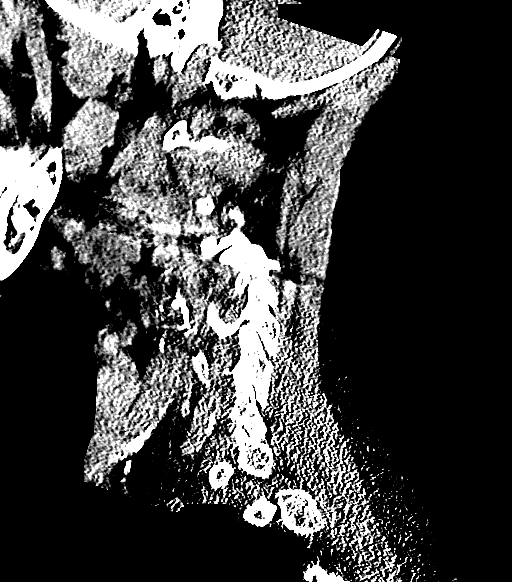
[im 41/61  brain]
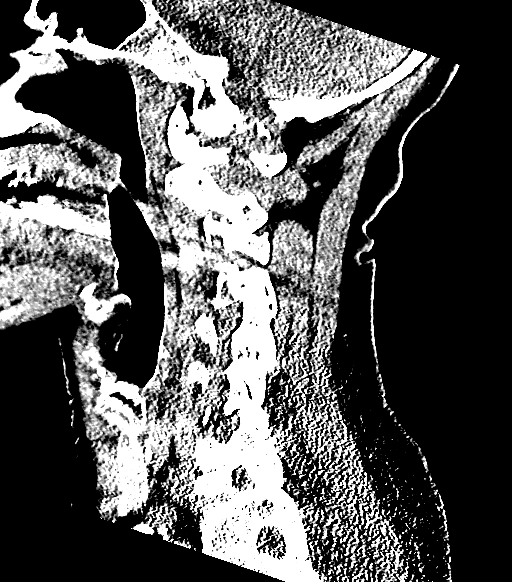

[Series 11: orthogonals · axial · 0.31mm/px · z∈[-339,-197]mm · 10 of 91 slices shown, 13 images]
[im 8/91  brain]
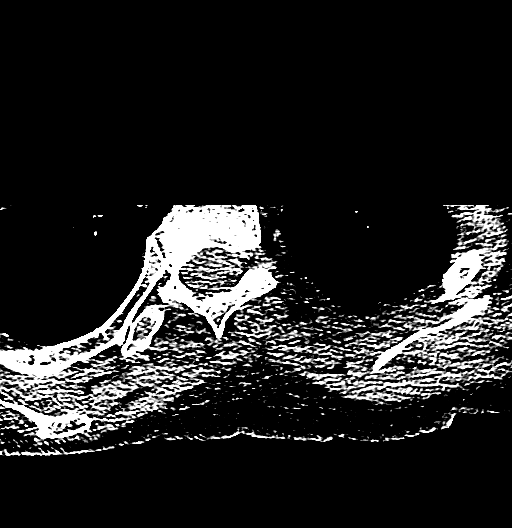
[im 8/91  bone]
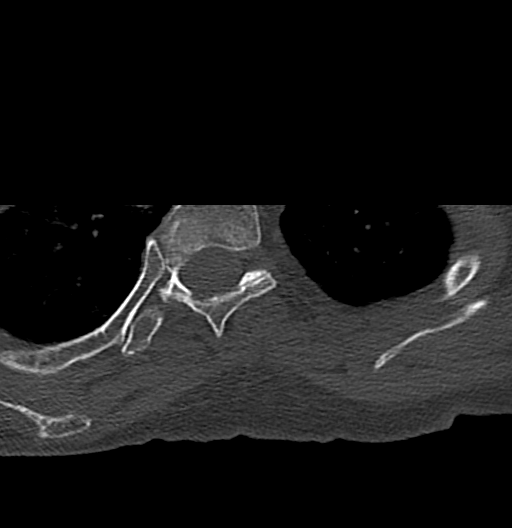
[im 16/91  brain]
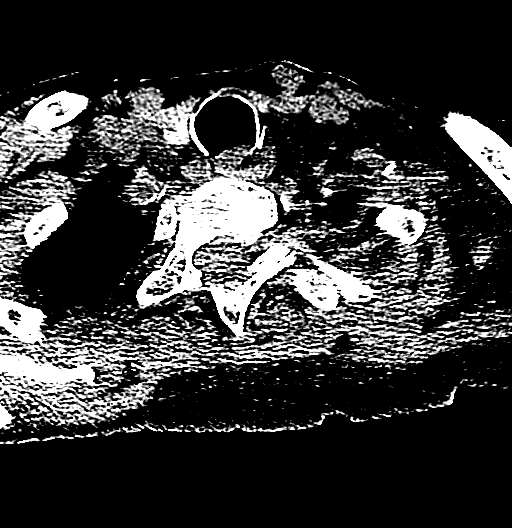
[im 23/91  brain]
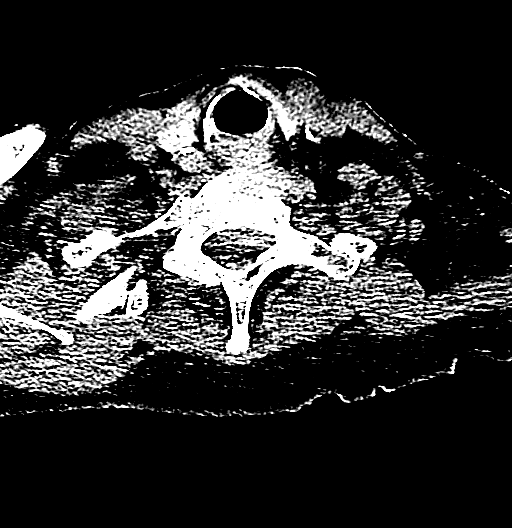
[im 31/91  brain]
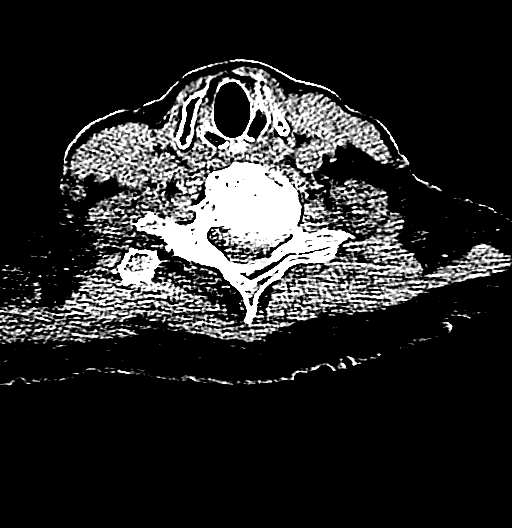
[im 38/91  brain]
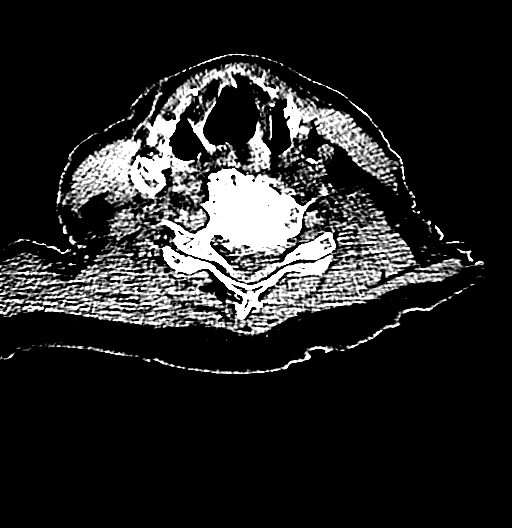
[im 38/91  bone]
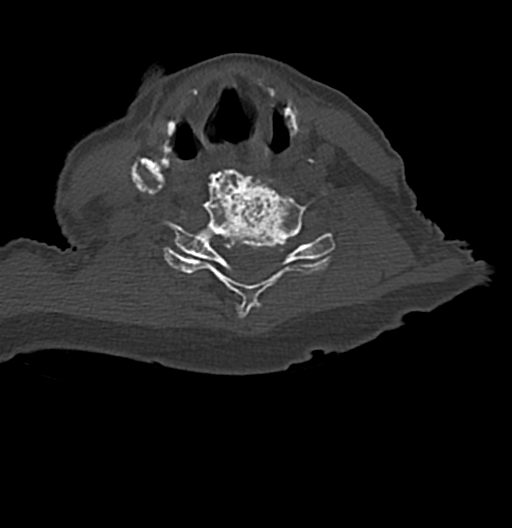
[im 53/91  brain]
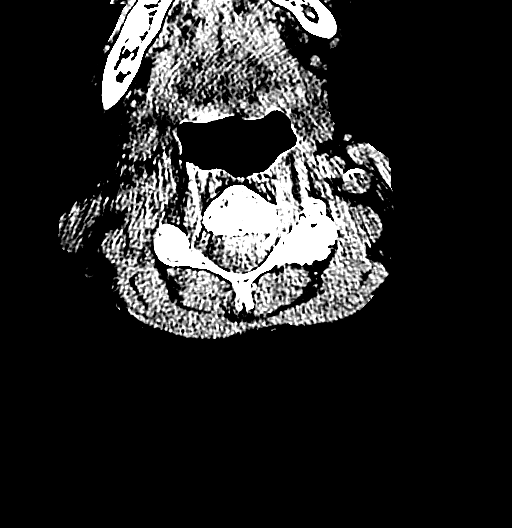
[im 61/91  brain]
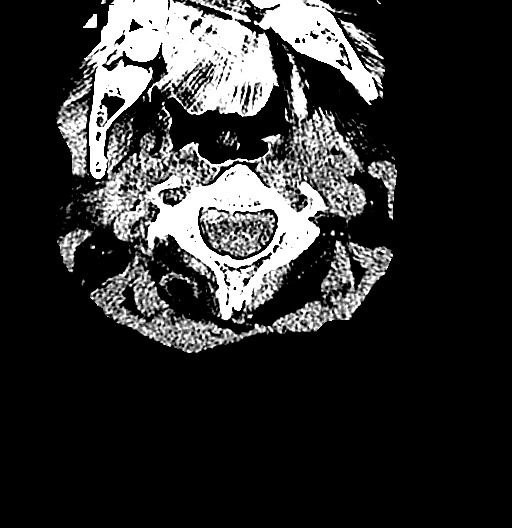
[im 68/91  brain]
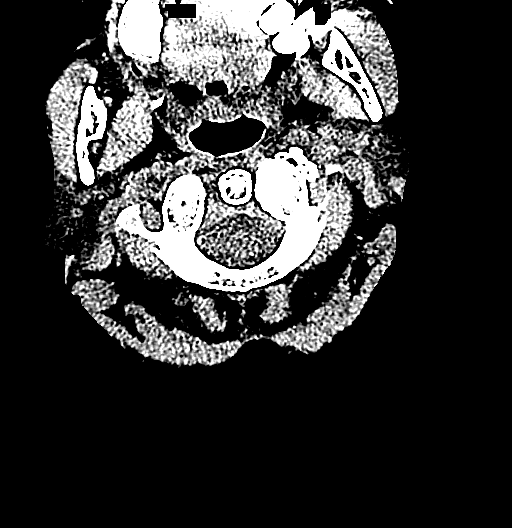
[im 76/91  brain]
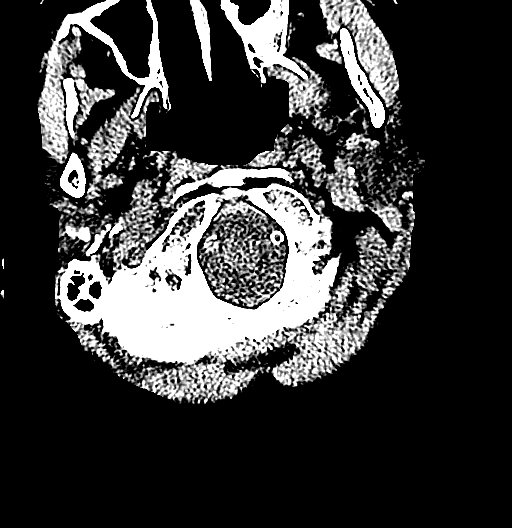
[im 76/91  bone]
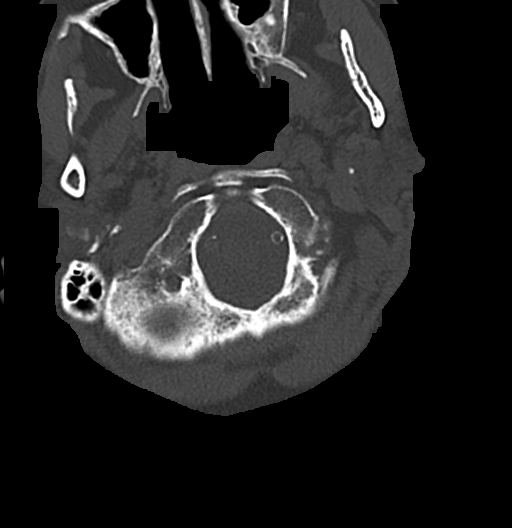
[im 83/91  brain]
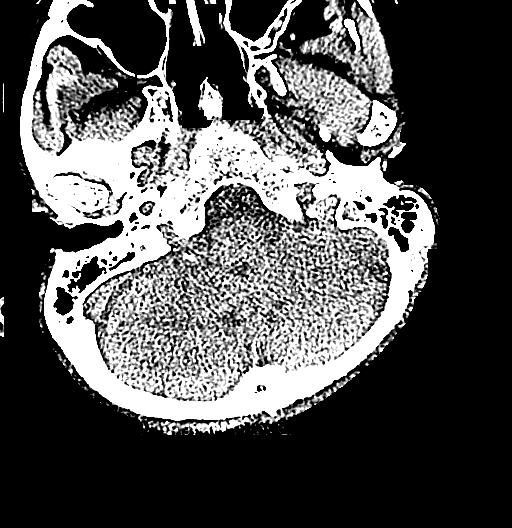

[15 of 47 positions shown; findings below may reference images not displayed]

FINDINGS: CT HEAD FINDINGS

Brain: No evidence of acute infarction, hemorrhage, extra-axial
collection, ventriculomegaly, or mass effect. Generalized cerebral
atrophy. Periventricular white matter low attenuation likely
secondary to microangiopathy.

Vascular: Cerebrovascular atherosclerotic calcifications are noted.

Skull: Negative for fracture or focal lesion.

Sinuses/Orbits: Visualized portions of the orbits are unremarkable.
Visualized portions of the paranasal sinuses and mastoid air cells
are unremarkable.

Other: Left frontal scalp soft tissue swelling.

CT CERVICAL SPINE FINDINGS

Alignment: 3 mm anterolisthesis of C2 on C3 secondary to facet
disease. 3 mm anterolisthesis of C3 on C4 secondary to facet
disease.

Skull base and vertebrae: No acute fracture. No primary bone lesion
or focal pathologic process.

Soft tissues and spinal canal: No prevertebral fluid or swelling. No
visible canal hematoma.

Disc levels: Osseous fusion across the C4-5 disc space. Severe
degenerative disc disease with disc height loss at C5-6 and C6-7.
Severe right and mild left facet arthropathy at C2-3. Severe left
facet arthropathy with left foraminal stenosis at C3-4. Broad-based
disc osteophyte complex at C4-5 with bilateral uncovertebral
degenerative changes and right facet arthropathy resulting in
bilateral foraminal stenosis. At C5-6 there is a broad-based disc
osteophyte complex with bilateral uncovertebral degenerative changes
and bilateral foraminal stenosis. At C6-7 there is a broad-based
disc osteophyte complex with bilateral uncovertebral degenerative
changes with bilateral foraminal stenosis.

Upper chest: Lung apices are clear.

Other: No fluid collection or hematoma. Bilateral carotid artery
atherosclerosis.
IMPRESSION: 1. No acute intracranial pathology.
2. No acute osseous injury the cervical spine.
3. Diffuse cervical spine spondylosis as described above.
